# Patient Record
Sex: Male | Born: 1990 | Race: Black or African American | Hispanic: No | Smoking: Current some day smoker
Health system: Southern US, Community
[De-identification: ages and names within clinical notes are randomized; demographics above are authoritative.]

## PROBLEM LIST (undated history)

## (undated) DIAGNOSIS — F32A Depression, unspecified: Secondary | ICD-10-CM

## (undated) DIAGNOSIS — F329 Major depressive disorder, single episode, unspecified: Secondary | ICD-10-CM

## (undated) DIAGNOSIS — S62309A Unspecified fracture of unspecified metacarpal bone, initial encounter for closed fracture: Secondary | ICD-10-CM

## (undated) HISTORY — PX: ORIF METACARPAL FRACTURE: SUR940

## (undated) HISTORY — PX: NO PAST SURGERIES: SHX2092

---
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
## 2010-07-12 LAB — COMPREHENSIVE METABOLIC PANEL
ALT: 10 U/L (ref 0–53)
AST: 16 U/L (ref 0–37)
Albumin: 3.7 g/dL (ref 3.5–5.2)
Alkaline Phosphatase: 70 U/L (ref 39–117)
CO2: 31 mEq/L (ref 19–32)
Calcium: 8.6 mg/dL (ref 8.4–10.5)
Chloride: 104 mEq/L (ref 96–112)
Creatinine, Ser: 0.87 mg/dL (ref 0.4–1.5)
GFR calc non Af Amer: >60 mL/min (ref >60)
Potassium: 3.4 mEq/L — ABNORMAL LOW (ref 3.5–5.1)
Sodium: 138 mEq/L (ref 135–145)
Total Protein: 6.4 g/dL (ref 6.0–8.3)

## 2010-07-12 LAB — CBC
Platelets: 184 10*3/uL (ref 150–400)
RBC: 4.92 MIL/uL (ref 4.22–5.81)
RDW: 12.6 % (ref 11.5–15.5)

## 2010-07-12 LAB — DIFFERENTIAL
Basophils Absolute: 0 10*3/uL (ref 0.0–0.1)
Eosinophils Absolute: 0.1 10*3/uL (ref 0.0–0.7)
Eosinophils Relative: 1 % (ref 0–5)
Lymphocytes Relative: 21 % (ref 12–46)

## 2010-07-26 LAB — BASIC METABOLIC PANEL
BUN: 11 mg/dL (ref 6–23)
Chloride: 101 mEq/L (ref 96–112)
GFR calc Af Amer: >60 mL/min (ref >60)
GFR calc non Af Amer: >60 mL/min (ref >60)
Glucose, Bld: 102 mg/dL — ABNORMAL HIGH (ref 70–99)

## 2010-07-26 LAB — DIFFERENTIAL
Lymphocytes Relative: 24 % (ref 12–46)
Monocytes Absolute: 0.5 10*3/uL (ref 0.1–1.0)
Neutro Abs: 3.2 10*3/uL (ref 1.7–7.7)
Neutrophils Relative %: 62 % (ref 43–77)

## 2010-07-26 LAB — CBC
Hemoglobin: 14.1 g/dL (ref 13.0–17.0)
MCHC: 33.6 g/dL (ref 30.0–36.0)
Platelets: 162 10*3/uL (ref 150–400)
RBC: 4.8 MIL/uL (ref 4.22–5.81)
RDW: 12.5 % (ref 11.5–15.5)

## 2010-07-26 LAB — URINALYSIS, ROUTINE W REFLEX MICROSCOPIC
Hgb urine dipstick: NEGATIVE
Protein, ur: NEGATIVE mg/dL
Urobilinogen, UA: 2 mg/dL — ABNORMAL HIGH (ref 0.0–1.0)
pH: 6 (ref 5.0–8.0)

## 2010-08-22 ENCOUNTER — Emergency Department (HOSPITAL_COMMUNITY)
Admission: EM | Admit: 2010-08-22 | Discharge: 2010-08-22 | Disposition: A | Discharge status: Home / Self Care | Payer: Medicaid Other | Attending: Emergency Medicine | Admitting: Emergency Medicine

## 2010-08-22 DIAGNOSIS — Y9383 Activity, rough housing and horseplay: Secondary | ICD-10-CM | POA: Insufficient documentation

## 2010-08-22 DIAGNOSIS — W19XXXA Unspecified fall, initial encounter: Secondary | ICD-10-CM | POA: Insufficient documentation

## 2010-08-22 DIAGNOSIS — S20219A Contusion of unspecified front wall of thorax, initial encounter: Secondary | ICD-10-CM | POA: Insufficient documentation

## 2010-10-23 ENCOUNTER — Emergency Department (HOSPITAL_COMMUNITY)
Admission: EM | Admit: 2010-10-23 | Discharge: 2010-10-24 | Disposition: A | Discharge status: Home / Self Care | Payer: Medicaid Other | Attending: Emergency Medicine | Admitting: Emergency Medicine

## 2010-10-23 DIAGNOSIS — F1210 Cannabis abuse, uncomplicated: Secondary | ICD-10-CM | POA: Insufficient documentation

## 2010-10-23 DIAGNOSIS — X58XXXA Exposure to other specified factors, initial encounter: Secondary | ICD-10-CM | POA: Insufficient documentation

## 2010-10-23 DIAGNOSIS — S6000XA Contusion of unspecified finger without damage to nail, initial encounter: Secondary | ICD-10-CM | POA: Insufficient documentation

## 2010-10-24 ENCOUNTER — Emergency Department (HOSPITAL_COMMUNITY): Payer: Medicaid Other

## 2010-10-28 ENCOUNTER — Encounter: Payer: Self-pay | Admitting: Emergency Medicine

## 2010-10-28 ENCOUNTER — Emergency Department (HOSPITAL_COMMUNITY)
Admission: EM | Admit: 2010-10-28 | Discharge: 2010-10-28 | Disposition: A | Discharge status: Home / Self Care | Payer: Medicaid Other | Attending: Emergency Medicine | Admitting: Emergency Medicine

## 2010-10-28 DIAGNOSIS — Z202 Contact with and (suspected) exposure to infections with a predominantly sexual mode of transmission: Secondary | ICD-10-CM | POA: Insufficient documentation
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
## 2011-02-12 ENCOUNTER — Emergency Department (HOSPITAL_COMMUNITY): Payer: Medicaid Other

## 2011-02-12 ENCOUNTER — Encounter (HOSPITAL_COMMUNITY): Payer: Self-pay | Admitting: *Deleted

## 2011-02-12 ENCOUNTER — Emergency Department (HOSPITAL_COMMUNITY)
Admission: EM | Admit: 2011-02-12 | Discharge: 2011-02-12 | Disposition: A | Discharge status: Home / Self Care | Payer: Medicaid Other | Attending: Emergency Medicine | Admitting: Emergency Medicine

## 2011-02-12 DIAGNOSIS — M25579 Pain in unspecified ankle and joints of unspecified foot: Secondary | ICD-10-CM | POA: Insufficient documentation

## 2011-02-12 DIAGNOSIS — M25569 Pain in unspecified knee: Secondary | ICD-10-CM | POA: Insufficient documentation

## 2011-02-12 DIAGNOSIS — M79609 Pain in unspecified limb: Secondary | ICD-10-CM | POA: Insufficient documentation

## 2011-02-12 DIAGNOSIS — M25561 Pain in right knee: Secondary | ICD-10-CM

## 2011-02-12 DIAGNOSIS — F17200 Nicotine dependence, unspecified, uncomplicated: Secondary | ICD-10-CM | POA: Insufficient documentation

## 2011-02-12 DIAGNOSIS — M79641 Pain in right hand: Secondary | ICD-10-CM

## 2011-02-12 MED ORDER — ACETAMINOPHEN 500 MG PO TABS
1000.0000 mg | ORAL_TABLET | Freq: Once | ORAL | Status: AC
  Administered 2011-02-12: 1000 mg via ORAL
  Filled 2011-02-12: qty 2

## 2011-02-12 MED ORDER — IBUPROFEN 800 MG PO TABS: 800.0000 mg | ORAL_TABLET | Freq: Once | ORAL | Status: DC
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
## 2011-02-20 ENCOUNTER — Emergency Department (HOSPITAL_COMMUNITY): Payer: Medicaid Other

## 2011-02-20 ENCOUNTER — Encounter (HOSPITAL_COMMUNITY): Payer: Self-pay | Admitting: Emergency Medicine

## 2011-02-20 ENCOUNTER — Emergency Department (HOSPITAL_COMMUNITY)
Admission: EM | Admit: 2011-02-20 | Discharge: 2011-02-20 | Disposition: A | Discharge status: Home / Self Care | Payer: Medicaid Other | Attending: Emergency Medicine | Admitting: Emergency Medicine

## 2011-02-20 DIAGNOSIS — R51 Headache: Secondary | ICD-10-CM | POA: Insufficient documentation

## 2011-02-20 DIAGNOSIS — M542 Cervicalgia: Secondary | ICD-10-CM | POA: Insufficient documentation

## 2011-02-20 DIAGNOSIS — R079 Chest pain, unspecified: Secondary | ICD-10-CM | POA: Insufficient documentation

## 2011-02-20 MED ORDER — ACETAMINOPHEN 500 MG PO TABS
1000.0000 mg | ORAL_TABLET | Freq: Once | ORAL | Status: AC
  Administered 2011-02-20: 1000 mg via ORAL
  Filled 2011-02-20: qty 2

## 2011-02-20 MED ORDER — TRAMADOL-ACETAMINOPHEN 37.5-325 MG PO TABS: ORAL_TABLET | ORAL | Status: AC

## 2011-02-20 MED ORDER — CYCLOBENZAPRINE HCL 10 MG PO TABS
10.0000 mg | ORAL_TABLET | Freq: Once | ORAL | Status: AC
  Administered 2011-02-20: 10 mg via ORAL
  Filled 2011-02-20: qty 1

## 2011-02-20 MED ORDER — CYCLOBENZAPRINE HCL 10 MG PO TABS: 10.0000 mg | ORAL_TABLET | Freq: Three times a day (TID) | ORAL | Status: AC | PRN

## 2011-02-20 NOTE — ED Notes (Signed)
Patient stating he needs to use restroom. Patient assisted with urinal. Denies any other needs at this time.

## 2011-02-20 NOTE — ED Notes (Signed)
Pt left the er stating  No needs self ambulating with a steady gait
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
## 2011-04-05 ENCOUNTER — Encounter (HOSPITAL_COMMUNITY): Payer: Self-pay | Admitting: Emergency Medicine

## 2011-04-05 ENCOUNTER — Emergency Department (HOSPITAL_COMMUNITY)
Admission: EM | Admit: 2011-04-05 | Discharge: 2011-04-05 | Disposition: A | Discharge status: Home / Self Care | Payer: Medicaid Other | Attending: Emergency Medicine | Admitting: Emergency Medicine

## 2011-04-05 DIAGNOSIS — K089 Disorder of teeth and supporting structures, unspecified: Secondary | ICD-10-CM | POA: Insufficient documentation

## 2011-04-05 DIAGNOSIS — F17200 Nicotine dependence, unspecified, uncomplicated: Secondary | ICD-10-CM | POA: Insufficient documentation

## 2011-04-05 DIAGNOSIS — K0889 Other specified disorders of teeth and supporting structures: Secondary | ICD-10-CM

## 2011-04-05 MED ORDER — HYDROCODONE-ACETAMINOPHEN 5-325 MG PO TABS
1.0000 | ORAL_TABLET | Freq: Once | ORAL | Status: AC
  Administered 2011-04-05: 1 via ORAL
  Filled 2011-04-05: qty 1

## 2011-04-05 MED ORDER — PENICILLIN V POTASSIUM 500 MG PO TABS: 500.0000 mg | ORAL_TABLET | Freq: Four times a day (QID) | ORAL | Status: AC

## 2011-04-05 MED ORDER — HYDROCODONE-ACETAMINOPHEN 5-325 MG PO TABS: 1.0000 | ORAL_TABLET | Freq: Four times a day (QID) | ORAL | Status: AC | PRN

## 2011-04-05 MED ORDER — PENICILLIN V POTASSIUM 250 MG PO TABS
500.0000 mg | ORAL_TABLET | Freq: Once | ORAL | Status: AC
  Administered 2011-04-05: 500 mg via ORAL
  Filled 2011-04-05: qty 2

## 2011-04-05 NOTE — ED Provider Notes (Signed)
Medical screening examination/treatment/procedure(s) were performed by non-physician practitioner and as supervising physician I was immediately available for consultation/collaboration.  Donnetta Hutching, MD 04/05/11 [phone]

## 2011-04-05 NOTE — ED Provider Notes (Signed)
History     CSN: [id number] Arrival date & time: 04/05/2011  2:47 PM   None     Chief Complaint  Patient presents with  . Dental Pain    (Consider location/radiation/quality/duration/timing/severity/associated sxs/prior treatment) Patient is a 20 y.o. male presenting with tooth pain. The history is provided by the patient and the spouse. No language interpreter was used.  Dental PainThe primary symptoms include mouth pain. Primary symptoms do not include dental injury. Episode onset: 3 days ago. The symptoms are unchanged. The symptoms occur constantly.  Additional symptoms include: jaw pain.    History reviewed. No pertinent past medical history.  Past Surgical History  Procedure Date  . Wrist surgery     History reviewed. No pertinent family history.  History  Substance Use Topics  . Smoking status: Current Everyday Smoker    Types: Cigarettes  . Smokeless tobacco: Not on file  . Alcohol Use: Yes     Occ      Review of Systems  Allergies  Oxycodone and Ibuprofen  Home Medications  No current outpatient prescriptions on file.  BP 127/66  Pulse 80  Temp 99.1 F (37.3 C)  Resp 20  Ht 5\' 6"  (1.676 m)  Wt 142 lb (64.411 kg)  BMI 22.92 kg/m2  SpO2 99%  Physical Exam  Nursing note and vitals reviewed. Constitutional: He is oriented to person, place, and time. He appears well-developed and well-nourished.  HENT:  Head: Normocephalic and atraumatic.  Nose: Nose normal.  Mouth/Throat: Oropharynx is clear and moist. No uvula swelling. No oropharyngeal exudate.    Eyes: EOM are normal.  Neck: Normal range of motion.  Cardiovascular: Normal rate, regular rhythm, normal heart sounds and intact distal pulses.   Pulmonary/Chest: Effort normal and breath sounds normal. No respiratory distress.  Abdominal: Soft. He exhibits no distension. There is no tenderness.  Musculoskeletal: Normal range of motion.  Neurological: He is alert and oriented to person, place,  and time.  Skin: Skin is warm and dry.  Psychiatric: He has a normal mood and affect. Judgment normal.    ED Course  Procedures (including critical care time)  Labs Reviewed - No data to display No results found.   No diagnosis found.    MDM          Worthy Rancher, PA 04/05/11 [phone]

## 2011-04-05 NOTE — ED Notes (Signed)
Pt c/o right lower toothache x 3 days.  

## 2012-06-21 ENCOUNTER — Encounter (HOSPITAL_COMMUNITY): Payer: Self-pay | Admitting: *Deleted

## 2012-06-21 ENCOUNTER — Emergency Department (HOSPITAL_COMMUNITY)
Admission: EM | Admit: 2012-06-21 | Discharge: 2012-06-22 | Disposition: A | Discharge status: Home / Self Care | Payer: Medicaid Other | Attending: Emergency Medicine | Admitting: Emergency Medicine

## 2012-06-21 DIAGNOSIS — R110 Nausea: Secondary | ICD-10-CM | POA: Insufficient documentation

## 2012-06-21 DIAGNOSIS — F1210 Cannabis abuse, uncomplicated: Secondary | ICD-10-CM | POA: Insufficient documentation

## 2012-06-21 DIAGNOSIS — R1110 Vomiting, unspecified: Secondary | ICD-10-CM

## 2012-06-21 DIAGNOSIS — F1010 Alcohol abuse, uncomplicated: Secondary | ICD-10-CM | POA: Insufficient documentation

## 2012-06-21 DIAGNOSIS — R109 Unspecified abdominal pain: Secondary | ICD-10-CM | POA: Insufficient documentation

## 2012-06-21 DIAGNOSIS — K920 Hematemesis: Secondary | ICD-10-CM | POA: Insufficient documentation

## 2012-06-21 DIAGNOSIS — F17200 Nicotine dependence, unspecified, uncomplicated: Secondary | ICD-10-CM | POA: Insufficient documentation

## 2012-06-21 MED ORDER — FAMOTIDINE 20 MG PO TABS
20.0000 mg | ORAL_TABLET | Freq: Once | ORAL | Status: AC
  Administered 2012-06-21: 20 mg via ORAL
  Filled 2012-06-21: qty 1

## 2012-06-21 MED ORDER — ONDANSETRON 4 MG PO TBDP
4.0000 mg | ORAL_TABLET | Freq: Once | ORAL | Status: AC
  Administered 2012-06-21: 4 mg via ORAL
  Filled 2012-06-21: qty 1

## 2012-06-21 MED ORDER — PANTOPRAZOLE SODIUM 40 MG PO TBEC
40.0000 mg | DELAYED_RELEASE_TABLET | Freq: Once | ORAL | Status: AC
  Administered 2012-06-21: 40 mg via ORAL
  Filled 2012-06-21: qty 1

## 2012-06-21 NOTE — ED Notes (Addendum)
Pt vomiting bright red blood since last night. Pt also c/o abdominal pain

## 2012-06-21 NOTE — ED Provider Notes (Signed)
History     CSN: [id number]  Arrival date & time 06/21/12  2211   First MD Initiated Contact with Patient 06/21/12 2258      Chief Complaint  Patient presents with  . Hematemesis    (Consider location/radiation/quality/duration/timing/severity/associated sxs/prior treatment) HPI Lyndell Val Eagle Whiters is a 22 y.o. male who presents to the Emergency Department complaining of vomiting blood after drinking last night. He has had some vomiting today all with blood specks in the emesis.Today his stomach is in pain.  History reviewed. No pertinent past medical history.  Past Surgical History  Procedure Laterality Date  . Wrist surgery      History reviewed. No pertinent family history.  History  Substance Use Topics  . Smoking status: Current Every Day Smoker    Types: Cigarettes  . Smokeless tobacco: Not on file  . Alcohol Use: Yes     Comment: Occ      Review of Systems  Constitutional: Negative for fever.       10 Systems reviewed and are negative for acute change except as noted in the HPI.  HENT: Negative for congestion.   Eyes: Negative for discharge and redness.  Respiratory: Negative for cough and shortness of breath.   Cardiovascular: Negative for chest pain.  Gastrointestinal: Positive for nausea, vomiting and abdominal pain.       Blood in emesis  Musculoskeletal: Negative for back pain.  Skin: Negative for rash.  Neurological: Negative for syncope, numbness and headaches.  Psychiatric/Behavioral:       No behavior change.    Allergies  Oxycodone and Ibuprofen  Home Medications  No current outpatient prescriptions on file.  BP 129/71  Pulse 78  Temp(Src) 98.3 F (36.8 C) (Oral)  Resp 20  Ht 5\' 6"  (1.676 m)  Wt 145 lb (65.772 kg)  BMI 23.41 kg/m2  SpO2 92%  Physical Exam  Nursing note and vitals reviewed. Constitutional: He is oriented to person, place, and time. He appears well-developed and well-nourished.  Awake, alert, nontoxic appearance.  HENT:   Head: Normocephalic and atraumatic.  Right Ear: External ear normal.  Left Ear: External ear normal.  Mouth/Throat: Oropharynx is clear and moist.  Eyes: EOM are normal. Pupils are equal, round, and reactive to light. Right eye exhibits no discharge. Left eye exhibits no discharge.  Neck: Neck supple.  Cardiovascular: Normal rate.   Pulmonary/Chest: Effort normal and breath sounds normal. He exhibits no tenderness.  Abdominal: Soft. Bowel sounds are normal. There is no tenderness. There is no rebound.  Musculoskeletal: Normal range of motion. He exhibits no tenderness.  Baseline ROM, no obvious new focal weakness.  Neurological: He is alert and oriented to person, place, and time.  Mental status and motor strength appears baseline for patient and situation.  Skin: No rash noted.  Psychiatric: He has a normal mood and affect.    ED Course  Procedures (including critical care time)     MDM  Patient who is a drinker had vomiting with blood in the emesis. Advised him to stop drinking, use protonix and follow up with GI. Referral to GI. Pt stable in ED with no significant deterioration in condition.The patient appears reasonably screened and/or stabilized for discharge and I doubt any other medical condition or other [facility] requiring further screening, evaluation, or treatment in the ED at this time prior to discharge.  MDM Reviewed: nursing note and vitals           Nicoletta Dress. Colon Branch, MD 06/21/12 2345
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
## 2012-06-22 NOTE — ED Notes (Signed)
Discharge instructions reviewed with pt, questions answered. Pt verbalized understanding.  

## 2012-08-21 ENCOUNTER — Emergency Department (HOSPITAL_COMMUNITY)
Admission: EM | Admit: 2012-08-21 | Discharge: 2012-08-21 | Disposition: A | Discharge status: Other Acute Inpatient Hospital | Payer: Medicaid Other | Attending: Emergency Medicine | Admitting: Emergency Medicine

## 2012-08-21 ENCOUNTER — Inpatient Hospital Stay (HOSPITAL_COMMUNITY)
Admission: EM | Admit: 2012-08-21 | Discharge: 2012-08-25 | DRG: 885 | Disposition: A | Discharge status: Home / Self Care | Payer: MEDICAID | Source: Intra-hospital | Attending: Psychiatry | Admitting: Psychiatry

## 2012-08-21 ENCOUNTER — Encounter (HOSPITAL_COMMUNITY): Payer: Self-pay

## 2012-08-21 DIAGNOSIS — F1210 Cannabis abuse, uncomplicated: Secondary | ICD-10-CM

## 2012-08-21 DIAGNOSIS — T43502A Poisoning by unspecified antipsychotics and neuroleptics, intentional self-harm, initial encounter: Secondary | ICD-10-CM | POA: Insufficient documentation

## 2012-08-21 DIAGNOSIS — T424X4A Poisoning by benzodiazepines, undetermined, initial encounter: Secondary | ICD-10-CM | POA: Insufficient documentation

## 2012-08-21 DIAGNOSIS — F329 Major depressive disorder, single episode, unspecified: Secondary | ICD-10-CM | POA: Insufficient documentation

## 2012-08-21 DIAGNOSIS — F39 Unspecified mood [affective] disorder: Principal | ICD-10-CM | POA: Diagnosis present

## 2012-08-21 DIAGNOSIS — T1491XA Suicide attempt, initial encounter: Secondary | ICD-10-CM

## 2012-08-21 DIAGNOSIS — Z79899 Other long term (current) drug therapy: Secondary | ICD-10-CM

## 2012-08-21 DIAGNOSIS — F3289 Other specified depressive episodes: Secondary | ICD-10-CM | POA: Insufficient documentation

## 2012-08-21 DIAGNOSIS — F17200 Nicotine dependence, unspecified, uncomplicated: Secondary | ICD-10-CM | POA: Insufficient documentation

## 2012-08-21 DIAGNOSIS — F909 Attention-deficit hyperactivity disorder, unspecified type: Secondary | ICD-10-CM | POA: Diagnosis present

## 2012-08-21 LAB — COMPREHENSIVE METABOLIC PANEL
ALT: 7 U/L (ref 0–53)
BUN: 17 mg/dL (ref 6–23)
CO2: 25 mEq/L (ref 19–32)
Calcium: 9.7 mg/dL (ref 8.4–10.5)
Creatinine, Ser: 1.02 mg/dL (ref 0.50–1.35)
GFR calc Af Amer: >90 mL/min (ref >90)
GFR calc non Af Amer: >90 mL/min (ref >90)
Glucose, Bld: 73 mg/dL (ref 70–99)

## 2012-08-21 LAB — URINALYSIS, ROUTINE W REFLEX MICROSCOPIC
Glucose, UA: NEGATIVE mg/dL
Leukocytes, UA: NEGATIVE
Protein, ur: NEGATIVE mg/dL
Urobilinogen, UA: 1 mg/dL (ref 0.0–1.0)

## 2012-08-21 LAB — CBC WITH DIFFERENTIAL/PLATELET
Eosinophils Relative: 1 % (ref 0–5)
HCT: 46.2 % (ref 39.0–52.0)
Lymphocytes Relative: 18 % (ref 12–46)
Lymphs Abs: 1.3 10*3/uL (ref 0.7–4.0)
MCV: 84.9 fL (ref 78.0–100.0)
Monocytes Absolute: 0.4 10*3/uL (ref 0.1–1.0)
Monocytes Relative: 6 % (ref 3–12)
RBC: 5.44 MIL/uL (ref 4.22–5.81)
WBC: 7.3 10*3/uL (ref 4.0–10.5)

## 2012-08-21 LAB — RAPID URINE DRUG SCREEN, HOSP PERFORMED
Barbiturates: NOT DETECTED
Cocaine: NOT DETECTED

## 2012-08-21 MED ORDER — ACETAMINOPHEN 325 MG PO TABS: 650.0000 mg | ORAL_TABLET | Freq: Four times a day (QID) | ORAL | Status: DC | PRN

## 2012-08-21 MED ORDER — ALUM & MAG HYDROXIDE-SIMETH 200-200-20 MG/5ML PO SUSP: 30.0000 mL | ORAL | Status: DC | PRN

## 2012-08-21 MED ORDER — ZOLPIDEM TARTRATE 5 MG PO TABS: 5.0000 mg | ORAL_TABLET | Freq: Every evening | ORAL | Status: DC | PRN

## 2012-08-21 MED ORDER — MAGNESIUM HYDROXIDE 400 MG/5ML PO SUSP: 30.0000 mL | Freq: Every day | ORAL | Status: DC | PRN

## 2012-08-21 MED ORDER — ONDANSETRON 4 MG PO TBDP
4.0000 mg | ORAL_TABLET | Freq: Once | ORAL | Status: AC
  Administered 2012-08-21: 4 mg via ORAL

## 2012-08-21 MED ORDER — ONDANSETRON 4 MG PO TBDP
ORAL_TABLET | ORAL | Status: AC
  Administered 2012-08-21: 4 mg via ORAL
  Filled 2012-08-21: qty 1

## 2012-08-21 NOTE — BH Assessment (Signed)
[facility] Assessment Progress Note      Accepted to [address] hall to Dr. Ree Shay service by Dr. Laury Deep.

## 2012-08-21 NOTE — ED Notes (Signed)
Mom states pt posted on facebook last night he didn't have anything to live for and nobody loved him. States he has been depressed for about a year because a friend hung herself and he feels guilty. Mom states pt has never been admitted for any psych issues in the past

## 2012-08-21 NOTE — ED Provider Notes (Signed)
Patient posted a suicide type noted on face book last evening. Denies any suicidal thoughts here and also had a serious overdose patient needs to be IVC and needs to have inpatient care.    James Jakes, MD 08/21/12 [phone]

## 2012-08-21 NOTE — ED Notes (Addendum)
Pt come to desk and stated that he needed to leave to go be with his family.  Pt states that "My brother just texted me to tell me my niece died."  Pt unable to provide details such as brother's last name or provide proof to validate what he was saying.   Explained to pt that since he was under IVC, he would not be free to leave as he wished.  Explained to pt that he could speak with the accepting physician and provide documentation to support claim, and perhaps legally he could be released, but unless the physician did so, we were unable to let him leave department at this time.

## 2012-08-21 NOTE — Tx Team (Signed)
Initial Interdisciplinary Treatment Plan  PATIENT STRENGTHS: (choose at least two) Ability for insight Active sense of humor Average or above average intelligence Physical Health Supportive family/friends  PATIENT STRESSORS: Legal issue Marital or family conflict Occupational concerns Substance abuse   PROBLEM LIST: Problem List/Patient Goals Date to be addressed Date deferred Reason deferred Estimated date of resolution  Substance Abuse 08/22/12                                                      DISCHARGE CRITERIA:  Ability to meet basic life and health needs Adequate post-discharge living arrangements Improved stabilization in mood, thinking, and/or behavior Motivation to continue treatment in a less acute level of care Verbal commitment to aftercare and medication compliance  PRELIMINARY DISCHARGE PLAN: Attend 12-step recovery group Outpatient therapy Return to previous living arrangement  PATIENT/FAMIILY INVOLVEMENT: This treatment plan has been presented to and reviewed with the patient, James Meadows, and/or family member.  The patient and family have been given the opportunity to ask questions and make suggestions.  [name] [facility] 08/21/2012, 11:28 PM

## 2012-08-21 NOTE — ED Notes (Signed)
Pt has vomited x 2

## 2012-08-21 NOTE — ED Notes (Signed)
Deputy to department to escort to [facility].

## 2012-08-21 NOTE — ED Provider Notes (Signed)
History    This chart was scribed for [employer]. Rubin Payor, MD by Marlyne Beards, ED Scribe. The patient was seen in room APA02/APA02. Patient's care was started at 11:29 AM.    CSN: [id number]  Arrival date & time 08/21/12  1123   First MD Initiated Contact with Patient 08/21/12 1129      Chief Complaint  Patient presents with  . Drug Overdose    (Consider location/radiation/quality/duration/timing/severity/associated sxs/prior treatment) The history is provided by the patient and a relative. No language interpreter was used.   James Meadows is a 22 y.o. male who presents to the Emergency Department complaining of a possible drug overdose. Pt states that he took an unknown persons Klonopin (about 12 pills) and drank a 1/2 of a 1/2 gallon of liquor. Mom states that pt posted on Facebook last night that he had nothing to live for anymore and nobody loved him. Pt states that he has been depressed for about a year now due to a friend hanging herself in the past. Pt somewhat blames himself for incident. Mother states pt has never been admitted for any psych issues in the past. Pt denies fever, chills, cough, nausea, vomiting, diarrhea, SOB, weakness, and any other associated symptoms.   History reviewed. No pertinent past medical history.  Past Surgical History  Procedure Laterality Date  . Wrist surgery      No family history on file.  History  Substance Use Topics  . Smoking status: Current Every Day Smoker    Types: Cigarettes  . Smokeless tobacco: Not on file  . Alcohol Use: Yes     Comment: Occ      Review of Systems  Constitutional: Positive for activity change.  Psychiatric/Behavioral: Positive for suicidal ideas and self-injury.  All other systems reviewed and are negative.    Allergies  Oxycodone and Ibuprofen  Home Medications  No current outpatient prescriptions on file.  BP 133/79  Pulse 96  Resp 24  SpO2 98%  Physical Exam  Nursing note and vitals  reviewed. Constitutional: He is oriented to person, place, and time. He appears well-developed and well-nourished. No distress.  Sedated and tearful  HENT:  Head: Normocephalic and atraumatic.  Eyes: EOM are normal.   Pt's pupils are a 3 and reactive.  Neck: Neck supple. No tracheal deviation present.  Cardiovascular: Normal rate.   Pulmonary/Chest: Effort normal. No respiratory distress.  Musculoskeletal: Normal range of motion.  Neurological: He is alert and oriented to person, place, and time.  Skin: Skin is warm and dry.  Psychiatric: He has a normal mood and affect. His behavior is normal.    ED Course  Procedures (including critical care time) DIAGNOSTIC STUDIES: Oxygen Saturation is 98% on room air, normal by my interpretation.    COORDINATION OF CARE: 11:41 AM Discussed ED treatment with pt and pt agrees.      Labs Reviewed  SALICYLATE LEVEL - Abnormal; Notable for the following:    Salicylate Lvl <2.0 (*)    All other components within normal limits  CBC WITH DIFFERENTIAL  COMPREHENSIVE METABOLIC PANEL  ETHANOL  ACETAMINOPHEN LEVEL  URINE RAPID DRUG SCREEN (HOSP PERFORMED)  URINALYSIS, ROUTINE W REFLEX MICROSCOPIC   No results found.   1. Benzodiazepine (tranquilizer) overdose, initial encounter   2. Suicide attempt      Date: 08/21/2012  Rate: 80  Rhythm: normal sinus rhythm  QRS Axis: normal  Intervals: normal  ST/T Wave abnormalities: normal  Conduction Disutrbances: none  Narrative Interpretation:  unremarkable     MDM  Patient on overdose of alcohol and Klonopin. Reportedly started last night it went through to the morning. He appears to be medically cleared at this time. Urine drug screen is still pending. He states that he took it in a suicide attempt and will be seen by the ACT team for further placement.    I personally performed the services described in this documentation, which was scribed in my presence. The recorded information has  been reviewed and is accurate.          Juliet Rude. Rubin Payor, MD 08/21/12 1421
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
## 2012-08-22 ENCOUNTER — Encounter (HOSPITAL_COMMUNITY): Payer: Self-pay | Admitting: Psychiatry

## 2012-08-22 DIAGNOSIS — F909 Attention-deficit hyperactivity disorder, unspecified type: Secondary | ICD-10-CM | POA: Diagnosis present

## 2012-08-22 DIAGNOSIS — F1210 Cannabis abuse, uncomplicated: Secondary | ICD-10-CM | POA: Diagnosis present

## 2012-08-22 DIAGNOSIS — F39 Unspecified mood [affective] disorder: Secondary | ICD-10-CM | POA: Diagnosis present

## 2012-08-22 LAB — RAPID URINE DRUG SCREEN, HOSP PERFORMED
Amphetamines: NOT DETECTED
Opiates: NOT DETECTED

## 2012-08-22 MED ORDER — TRAZODONE HCL 50 MG PO TABS
50.0000 mg | ORAL_TABLET | Freq: Every evening | ORAL | Status: DC | PRN
  Administered 2012-08-22: 50 mg via ORAL
  Filled 2012-08-22: qty 1

## 2012-08-22 NOTE — Progress Notes (Signed)
D: Patient denies SI/HI and A/V hallucinations; patient reports is sleep was well because " I was messed up";  ; reports energy level is normal ; reports ability to pay attention is normal ; rates depression as -/10; rates hopelessness -/10; rates anxiety as -/10;   A: Monitored q 15 minutes; patient encouraged to attend groups; patient educated about medications; patient given medications per physician orders; patient encouraged to express feelings and/or concerns  R: Patient is very child like and playful on the unit and insist that he does not need to be here; patient's interaction with staff and peers is appropriate;; patient is taking medications as prescribed and tolerating medications; patient is attended one group but patient is superficial about the situation and minimizes

## 2012-08-22 NOTE — Tx Team (Signed)
Interdisciplinary Treatment Plan Update (Adult)  Date: 08/22/2012  Time Reviewed: 9:45 AM   Progress in Treatment: Attending groups: Yes Participating in groups: Yes Taking medication as prescribed:  Yes Tolerating medication:  Yes Family/Significant othe contact made: Not as yet Patient understands diagnosis: Yes Discussing patient identified problems/goals with staff: Yes Medical problems stabilized or resolved:  Yes Denies suicidal/homicidal ideation: Yes Patient has not harmed self or Others: Yes  New problem(s) identified: None Identified  Discharge Plan or Barriers:  CSW to assess for appropriate referrals.   Additional comments: N/A  Reason for Continuation of Hospitalization: Medication stabilization Crisi Stabilization  Estimated length of stay: 3- 5 days as no one has yet to meet with patient other than his brief attendance at DC planning group    Attendees: Patient:   Family:     Physician:  Geoffery Lyons 08/22/2012 9:45 AM   Nursing: Carney Living, RN    08/22/2012 9:45 AM   Clinical Social Worker Ronda Fairly 08/22/2012 9:45 AM   Other:  Dorinda Hill, Sherrie Sport PA Student 08/22/2012 9:45 AM   Other:  Enid Baas, PA 08/22/2012 9:45 AM   Other:  Titus Mould, Community Care Coordinator 08/22/2012 9:45 AM   Other:  Serena Colonel, PA 08/22/2012 9:45 AM    Scribe for Treatment Team:   Carney Bern, LCSWA  08/22/2012 9:45 AM

## 2012-08-22 NOTE — [facility] Group Notes (Signed)
[facility] LCSW Aftercare Discharge Planning Group Note   08/22/2012  8:45 AM  Participation Quality:  Adequate  Mood/Affect:  Flat, Lethargic and Intrussive  Depression Rating:  1  Anxiety Rating:  1  Thoughts of Suicide:  No Will you contract for safety?   NA  Current AVH:  No  Plan for Discharge/Comments:  Return home to parents with whom he lives in [place]  Transportation Means: Family or sheriff  Supports: Family  Tycen Dockter, Julious Payer

## 2012-08-22 NOTE — Progress Notes (Signed)
Patient ID: James Meadows, male   DOB: Aug 29, 1990, 22 y.o.   MRN: [id number] D: Patient mood/affect is appropriate to situation. Pt asked when he can be discharged and misses his family. Pt does seem invested in treatment and minimize his condition. Pt denies SI/HI/AVH and pain. Pt attended evening wrap up group and Interacted appropriately with peers. Pt denies any needs or concerns.  Cooperative with assessment. No acute distressed noted at this time.   A: Met with pt 1:1. Medications administered as prescribed. Writer encouraged pt to discuss feelings. Pt encouraged to come to staff with any question or concerns. 15 minutes checks for safety.  R: Patient remains safe. He is complaint with medications and denies any adverse reaction. Continue current POC.
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
## 2012-08-22 NOTE — Progress Notes (Signed)
[facility] LCSW Group Therapy  08/22/2012 1:15 PM  Type of Therapy:  Group Therapy 1:15 to 2:30 PM  Participation Level:  Did Not Attend   Clide Dales 08/22/2012, 4:41 PM
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
## 2012-08-22 NOTE — Progress Notes (Signed)
Patient ID: James Meadows, male   DOB: 1991-01-06, 22 y.o.   MRN: [id number]  D: Took over patient's care @ 2330. Patient in bed sleeping. Respiration regular and unlabored. No sign of distress noted at this time.  A: 15 mins checks for safety.  R: Patient remains asleep. Pt is safe.

## 2012-08-23 DIAGNOSIS — F39 Unspecified mood [affective] disorder: Principal | ICD-10-CM

## 2012-08-23 DIAGNOSIS — F1210 Cannabis abuse, uncomplicated: Secondary | ICD-10-CM

## 2012-08-23 DIAGNOSIS — F909 Attention-deficit hyperactivity disorder, unspecified type: Secondary | ICD-10-CM

## 2012-08-23 MED ORDER — TRAZODONE HCL 100 MG PO TABS
100.0000 mg | ORAL_TABLET | Freq: Every evening | ORAL | Status: DC | PRN
  Administered 2012-08-23 – 2012-08-24 (×2): 100 mg via ORAL
  Filled 2012-08-23 (×2): qty 1

## 2012-08-23 NOTE — Progress Notes (Signed)
[facility] MD Progress Note  08/23/2012 3:51 PM Zaquan RUSTY VILLELLA  MRN:  [id number]  Subjective: Lucy reports, "The reason that I came here is because some reported that I took bunch of Kolanadin (Klonopin) pills to kill myself. But that was not the truth. I took 2 little green sleeping pills to help me sleep. I was not trying to kill myself. I feel aggravated when someone lies on me like that. I miss my 2 kids".  Diagnosis:  Axis I: ADHD, hyperactive type and Marijuana abuse, Episodic mood disorder Axis II: Deferred Axis III: History reviewed. No pertinent past medical history. Axis IV: other psychosocial or environmental problems Axis V: 41-50 serious symptoms  ADL's:  Fair  Sleep: Fair  Appetite:  Fair  Suicidal Ideation:  Plan:  denies Intent:  Denies Means:  Denies Homicidal Ideation:  Plan:  Denies Intent:  Denies Means:  [name]  AEB (as evidenced by): Per patient's reports.  Psychiatric Specialty Exam: Review of Systems  Constitutional: Negative.   HENT: Negative.   Eyes: Negative.   Respiratory: Negative.   Cardiovascular: Negative.   Gastrointestinal: Negative.   Genitourinary: Negative.   Musculoskeletal: Negative.   Skin: Negative.        Numerous tattoos  Neurological: Negative.   Endo/Heme/Allergies: Negative.   Psychiatric/Behavioral: Positive for depression and substance abuse. Negative for suicidal ideas, hallucinations and memory loss. The patient has insomnia. The patient is not nervous/anxious.     Blood pressure 108/68, pulse 109, temperature 98.2 F (36.8 C), temperature source Other (Comment), resp. rate 18, height 5\' 7"  (1.702 m), weight 63.05 kg (139 lb).Body mass index is 21.77 kg/(m^2).  General Appearance: Disheveled  Eye Contact::  Poor  Speech:  Clear and Coherent  Volume:  Normal  Mood:  Depressed  Affect:  Flat and Tearful  Thought Process:  Coherent  Orientation:  Full (Time, Place, and Person)  Thought Content:  Rumination  Suicidal  Thoughts:  No  Homicidal Thoughts:  No  Memory:  Immediate;   Good Recent;   Good Remote;   Good  Judgement:  Fair  Insight:  Fair  Psychomotor Activity:  Normal  Concentration:  Fair  Recall:  Good  Akathisia:  No  Handed:  Right  AIMS (if indicated):     Assets:  Desire for Improvement  Sleep:  Number of Hours: 6.5   Current Medications: Current Facility-Administered Medications  Medication Dose Route Frequency Provider Last Rate Last Dose  . acetaminophen (TYLENOL) tablet 650 mg  650 mg Oral Q6H PRN Larena Sox, MD      . alum & mag hydroxide-simeth (MAALOX/MYLANTA) 200-200-20 MG/5ML suspension 30 mL  30 mL Oral Q4H PRN Larena Sox, MD      . magnesium hydroxide (MILK OF MAGNESIA) suspension 30 mL  30 mL Oral Daily PRN Larena Sox, MD      . traZODone (DESYREL) tablet 50 mg  50 mg Oral QHS PRN Rachael Fee, MD   50 mg at 08/22/12 2213    Lab Results:  Results for orders placed during the hospital encounter of 08/21/12 (from the past 48 hour(s))  URINE RAPID DRUG SCREEN (HOSP PERFORMED)     Status: Abnormal   Collection Time    08/22/12  6:49 AM      Result Value Range   Opiates NONE DETECTED  NONE DETECTED   Cocaine NONE DETECTED  NONE DETECTED   Benzodiazepines POSITIVE (*) NONE DETECTED   Amphetamines NONE DETECTED  NONE DETECTED  Tetrahydrocannabinol POSITIVE (*) NONE DETECTED   Barbiturates NONE DETECTED  NONE DETECTED   Comment:            DRUG SCREEN FOR MEDICAL PURPOSES     ONLY.  IF CONFIRMATION IS NEEDED     FOR ANY PURPOSE, NOTIFY LAB     WITHIN 5 DAYS.                LOWEST DETECTABLE LIMITS     FOR URINE DRUG SCREEN     Drug Class       Cutoff (ng/mL)     Amphetamine      1000     Barbiturate      200     Benzodiazepine   200     Tricyclics       300     Opiates          300     Cocaine          300     THC              50    Physical Findings: AIMS: Facial and Oral Movements Muscles of Facial Expression: None, normal Lips and  Perioral Area: None, normal Jaw: None, normal Tongue: None, normal,Extremity Movements Upper (arms, wrists, hands, fingers): None, normal Lower (legs, knees, ankles, toes): None, normal, Trunk Movements Neck, shoulders, hips: None, normal, Overall Severity Severity of abnormal movements (highest score from questions above): None, normal Incapacitation due to abnormal movements: None, normal Patient's awareness of abnormal movements (rate only patient's report): No Awareness, Dental Status Current problems with teeth and/or dentures?: No Does patient usually wear dentures?: No  CIWA:    COWS:     Treatment Plan Summary: Daily contact with patient to assess and evaluate symptoms and progress in treatment Medication management  Plan: Supportive approach/coping skills/relapse prevention. Encouraged out of room, participation in group sessions and application of coping skills when distressed. Will continue to monitor response to/adverse effects of medications in use to assure effectiveness. Continue to monitor mood, behavior and interaction with staff and other patients. Continue current plan of care.  Medical Decision Making Problem Points:  Established problem, stable/improving (1), Review of last therapy session (1) and Review of psycho-social stressors (1) Data Points:  Review of medication regiment & side effects (2) Review of new medications or change in dosage (2)  I certify that inpatient services furnished can reasonably be expected to improve the patient's condition.   Armandina Stammer I 08/23/2012, 3:51 PM

## 2012-08-23 NOTE — Progress Notes (Signed)
Patient attended meeting AA meeting

## 2012-08-23 NOTE — Progress Notes (Signed)
D   Pt has been steadily focused on being discharged today   He brings the issue up at all groups and has been instructed several times on the process for discharge   He does attend group but is very limited in his participation    A   Verbal support given   Medications administered and effectiveness monitored    Q 15 min checks R   Pt safe at present

## 2012-08-23 NOTE — Progress Notes (Signed)
Psychoeducational Group Note  Date:  08/23/2012 Time:  0945 am  Group Topic/Focus:  Identifying Needs:   The focus of this group is to help patients identify their personal needs that have been historically problematic and identify healthy behaviors to address their needs.  Participation Level:  Minimal  Participation Quality:  Resistant  Affect:  Defensive  Cognitive:  Alert  Insight:  Limited  Engagement in Group:  Limited  Additional Comments:    James Meadows 08/23/2012,2:45 PM
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
## 2012-08-23 NOTE — [facility] Group Notes (Signed)
[facility] Group Notes:  (Clinical Social Work)  08/23/2012     10-11AM  Summary of Progress/Problems:   The main focus of today's process group was for the patient to identify ways in which they have in the past sabotaged their own recovery. Motivational Interviewing was utilized to ask the group members what they get out of their substance use, and what reasons they may have for wanting to change.  The Stages of Change were explained using a handout, and patients identified where they currently are with regard to stages of change.  The patient expressed that he drinks when his family ignores him, which contradicted what he had just a few minutes previously told CSW during PSA about his alcohol consumption being once every 3 months or so "for celebration purposes only."  He smokes to feel calm, and states that he smokes marijuana every 2-3 hours during the day.  He does not believe that the marijuana is a problem for him, and admits freely that he is in the pre-contemplation stage.  On the other hand, he feels his anger is a major issue, and he is in the preparation to handle that.  Type of Therapy:  Group Therapy - Process   Participation Level:  Active  Participation Quality:  Intrusive and Redirectable  Affect:  Blunted  Cognitive:  Disorganized  Insight:  Improving  Engagement in Therapy:  Engaged  Modes of Intervention:  Education, Support and Processing, Motivational Interviewing  Ambrose Mantle, LCSW 08/23/2012, 11:14 AM

## 2012-08-23 NOTE — B[facility] Group Notes (Signed)
Pt attended self inventory group and participated appropriately 

## 2012-08-24 NOTE — Progress Notes (Signed)
Adult Psychoeducational Group Note  Date:  08/24/2012 Time:  8:20 PM  Group Topic/Focus:  Goals Group:   The focus of this group is to help patients establish daily goals to achieve during treatment and discuss how the patient can incorporate goal setting into their daily lives to aide in recovery.  Participation Level:  Active  Participation Quality:  Appropriate  Affect:  Appropriate  Cognitive:  Alert  Insight: Appropriate  Engagement in Group:  Engaged  Modes of Intervention:  Discussion  Additional Comments:  Attended AA  Aldona Lento 08/24/2012, 8:20 PM

## 2012-08-24 NOTE — Progress Notes (Signed)
Psychoeducational Group Note  Date:  08/24/2012 Time:  0945 am  Group Topic/Focus:  Making Healthy Choices:   The focus of this group is to help patients identify negative/unhealthy choices they were using prior to admission and identify positive/healthier coping strategies to replace them upon discharge.  Participation Level:  None  Participation Quality:  Resistant  Affect:  Irritable  Cognitive:  Alert  Insight:  None  Engagement in Group:  None  Additional Comments:  Pt got up and walked out saying this is hurting more than helping  Andrena Mews 08/24/2012, 10:29 AM

## 2012-08-24 NOTE — Progress Notes (Signed)
Patient ID: James Meadows, male   DOB: 1990/05/10, 22 y.o.   MRN: [id number] [facility] MD Progress Note  08/24/2012 3:53 PM James Meadows  MRN:  [id number]  Subjective: James Meadows reports that he is feeling like he is here to stay. Reports that he is not crazy and should not be treated like one. Says he is not a pill popper. Complained that this place is not helping him at this point. He adds that he thinks that every body here thinks like he is crazy. As a result, it makes him feel aggravated.  Diagnosis:  Axis I: ADHD, hyperactive type and Marijuana abuse, Episodic mood disorder Axis II: Deferred Axis III: History reviewed. No pertinent past medical history. Axis IV: other psychosocial or environmental problems Axis V: 41-50 serious symptoms  ADL's:  Fair  Sleep: Fair  Appetite:  Fair  Suicidal Ideation:  Plan:  denies Intent:  Denies Means:  Denies Homicidal Ideation:  Plan:  Denies Intent:  Denies Means:  James Meadows  AEB (as evidenced by): Per patient's reports.  Psychiatric Specialty Exam: Review of Systems  Constitutional: Negative.   HENT: Negative.   Eyes: Negative.   Respiratory: Negative.   Cardiovascular: Negative.   Gastrointestinal: Negative.   Genitourinary: Negative.   Musculoskeletal: Negative.   Skin: Negative.        Numerous tattoos  Neurological: Negative.   Endo/Heme/Allergies: Negative.   Psychiatric/Behavioral: Positive for depression and substance abuse. Negative for suicidal ideas, hallucinations and memory loss. The patient has insomnia. The patient is not nervous/anxious.     Blood pressure 109/81, pulse 91, temperature 98 F (36.7 C), temperature source Other (Comment), resp. rate 16, height 5\' 7"  (1.702 m), weight 63.05 kg (139 lb).Body mass index is 21.77 kg/(m^2).  General Appearance: Disheveled  Eye Contact::  Poor  Speech:  Clear and Coherent  Volume:  Normal  Mood:  Depressed  Affect:  Flat and Tearful  Thought Process:  Coherent  Orientation:   Full (Time, Place, and Person)  Thought Content:  Rumination  Suicidal Thoughts:  No  Homicidal Thoughts:  No  Memory:  Immediate;   Good Recent;   Good Remote;   Good  Judgement:  Fair  Insight:  Fair  Psychomotor Activity:  Normal  Concentration:  Fair  Recall:  Good  Akathisia:  No  Handed:  Right  AIMS (if indicated):     Assets:  Desire for Improvement  Sleep:  Number of Hours: 6   Current Medications: Current Facility-Administered Medications  Medication Dose Route Frequency Provider Last Rate Last Dose  . acetaminophen (TYLENOL) tablet 650 mg  650 mg Oral Q6H PRN Larena Sox, MD      . alum & mag hydroxide-simeth (MAALOX/MYLANTA) 200-200-20 MG/5ML suspension 30 mL  30 mL Oral Q4H PRN Larena Sox, MD      . magnesium hydroxide (MILK OF MAGNESIA) suspension 30 mL  30 mL Oral Daily PRN Larena Sox, MD      . traZODone (DESYREL) tablet 100 mg  100 mg Oral QHS PRN Sanjuana Kava, NP   100 mg at 08/23/12 2156    Lab Results:  No results found for this or any previous visit (from the past 48 hour(s)).  Physical Findings: AIMS: Facial and Oral Movements Muscles of Facial Expression: None, normal Lips and Perioral Area: None, normal Jaw: None, normal Tongue: None, normal,Extremity Movements Upper (arms, wrists, hands, fingers): None, normal Lower (legs, knees, ankles, toes): None, normal, Trunk Movements Neck, shoulders, hips:  None, normal, Overall Severity Severity of abnormal movements (highest score from questions above): None, normal Incapacitation due to abnormal movements: None, normal Patient's awareness of abnormal movements (rate only patient's report): No Awareness, Dental Status Current problems with teeth and/or dentures?: No Does patient usually wear dentures?: No  CIWA:    COWS:     Treatment Plan Summary: Daily contact with patient to assess and evaluate symptoms and progress in treatment Medication management  Plan: Supportive  approach/coping skills/relapse prevention. Encouraged out of room, participation in group sessions and application of coping skills when distressed. Will continue to monitor response to/adverse effects of medications in use to assure effectiveness. Continue to monitor mood, behavior and interaction with staff and other patients. Continue current plan of care.  Medical Decision Making Problem Points:  Established problem, stable/improving (1), Review of last therapy session (1) and Review of psycho-social stressors (1) Data Points:  Review of medication regiment & side effects (2) Review of new medications or change in dosage (2)  I certify that inpatient services furnished can reasonably be expected to improve the patient's condition.   Armandina Stammer I 08/24/2012, 3:53 PM

## 2012-08-24 NOTE — B[facility] Group Notes (Signed)
[facility] Group Notes:  (Clinical Social Work)  08/24/2012  10:00-11:00AM  Summary of Progress/Problems:   The main focus of today's process group was to   identify the patient's current support system and decide on other supports that can be put in place.  Four definitions/levels of support were discussed and an exercise was utilized to show how much stronger we become with additional supports.  An emphasis was placed on using counselor, doctor, therapy groups, 12-step groups, and problem-specific support groups to expand supports, as well as doing something different than has been done before. The patient expressed a willingness to add a return to church, but refuses to add other supports.  He has the support of his mother, brother, sister, children, and his mother has asked him to stop smoking marijuana so he says he is going to do this, but will not have trouble, will just stop.  He says AA/NA are triggers for him.  He was very distracting during group and kept asking how to leave the hospital.  Type of Therapy:  Process Group with Motivational Interviewing  Participation Level:  Active  Participation Quality:  Intrusive and Redirectable  Affect:  Excited  Cognitive:  Oriented  Insight:  Distracting and Improving  Engagement in Therapy:  Developing/Improving  Modes of Intervention:   Education, Support and Processing, Activity  Ambrose Mantle, LCSW 08/24/2012, 11:14 AM

## 2012-08-24 NOTE — Progress Notes (Signed)
D   Pt has been isolative and irritable today   He attended group but expressed negativity during group and got up and walked out    He has no insight into his illness and doesn't want to hear anything about it A   Verbal support given  Medications administered and effectiveness monitored   Q 15 min checks  R   Pt safe at present
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
## 2012-08-24 NOTE — Progress Notes (Signed)
Pt intrusive this evening requiring redirect. At nurses' station during AA group demanding to know why he was admitted. "No one will tell me. I've asked everyone." Read to pt what was written on his IVC papers though he denies any truth to it. "I can't believe someone would do me like that." Told patient he is expected to attend all groups but pt refused. "I'm going to take a shower just in case you all think I'm going to kill myself." Redirected patient's behavior and explained staff's expectation for his behavior. Pt somewhat receptive. Medicated per orders, trazadone given per his request which has been effective. Pt displays no insight into his substance use and remains focused on discharge. Denies HI/AVH. James Meadows

## 2012-08-24 NOTE — Progress Notes (Signed)
Patient did not attend the evening speaker AA meeting. Pt showered and remained in room during group time.  Pt reported to this writer that he made some statements about wanting to end his life but would never really do it.  Pt reports feeling really down lately because he went two weeks without seeing his family and felt very alone.  Pt reports never feeling depressed before and would not kill himself.  Pt also denies taking "28 klonopin".  Pt stated, "I took two, and someone I know told my mom that I had 28 pills. I would be dead right now if I took 28 pills. I wouldn't be talking to you." Pt stated, " I felt like no one cared about me, but now I know they do."  This writer assured pt that we are not a long term facility and to not be so focused on having to leave.  Pt was told to think about how his mom felt when she found him and was unable to wake him, how scared she must have been.  Pt was also advised to use his time here to learn some skills so if he feels down or lonely in the future he can better communicate to his family.

## 2012-08-25 MED ORDER — TRAZODONE HCL 100 MG PO TABS: 100.0000 mg | ORAL_TABLET | Freq: Every evening | ORAL | Status: DC | PRN

## 2012-08-25 NOTE — Progress Notes (Signed)
Pt was discharged home today.  He denied any S/I H/I or A/V hallucinations.    He was given f/u appointment, rx, sample medications, hotline info booklet.  He voiced understanding to all instructions provided.  He declined the need for smoking cessation materials.  

## 2012-08-25 NOTE — Progress Notes (Signed)
Patient ID: James Meadows, male   DOB: [birth year], 22 y.o.   MRN: [id number] CSW has tried once again to contact patient's mother in order to provide suicide prevention education along with information of followup appointment as [facility] called to finalize followup appointment after patient left. Two numbers called in effort  to reach patient's mother, Laurina Bustle were [phone] and [phone]. Message was left on patient's cell phone [phone]) with information for followup at [facility] this Wed at 1:45 PM.  Carney Bern, LCSWA

## 2012-08-25 NOTE — [facility] Suicide Risk Assessment (Signed)
[facility] INPATIENT:  Family/Significant Other Suicide Prevention Education  Suicide Prevention Education:  Contact Attempts: Patient's mother  has been identified by the patient as the family member/significant other with whom the patient will be residing, and identified as the person(s) who will aid the patient in the event of a mental health crisis.  With written consent from the patient, two attempts were made to provide suicide prevention education, prior to and/or following the patient's discharge.  We were unsuccessful in providing suicide prevention education.  A suicide education pamphlet was given to the patient to share with family/significant other.  Date and time of first attempt: 08/25/2012/8:45 AM Date and time of second attempt: 08/25/2012 /12:23 PM   Clide Dales 08/25/2012, 12:23 PM

## 2012-08-25 NOTE — Progress Notes (Signed)
[facility] Adult Case Management Discharge Plan :  Will you be returning to the same living situation after discharge: Yes,  with mom At discharge, do you have transportation home?:Yes,  Mother Do you have the ability to pay for your medications:Yes,  at [facility]  Release of information consent forms completed and in the chart;  Patient's signature needed at discharge.  Patient to Follow up at: Follow-up Information   Follow up with [facility] On 08/27/2012. (Appointment at 1:45 PM on Wednesday 08/27/12)    Contact information:   [address] [place], [place] [address] [facility] [phone] FAX [phone]      Patient denies SI/HI:   Yes,  denies both    Safety Planning and Suicide Prevention discussed:  Yes,  with patient as unsuccessful in reaching patient's mother  Clide Dales 08/25/2012, 5:51 PM

## 2012-08-25 NOTE — [facility] Suicide Risk Assessment (Signed)
Suicide Risk Assessment  Discharge Assessment     Demographic Factors:  Male and Adolescent or young adult  Mental Status Per Nursing Assessment::   On Admission:  NA  Current Mental Status by Physician: In full contact with reality. There are no suicidal ideas plans or intent. His mood is euthymic his affect is appropriate. He is willing and motivated to pursue outpatient treatment   Loss Factors: Legal issues  Historical Factors: Impulsivity  Risk Reduction Factors:   Responsible for children under 24 years of age, Sense of responsibility to family, Employed, Living with another person, especially a relative and Positive social support  Continued Clinical Symptoms:  Alcohol/Substance Abuse/Dependencies  Cognitive Features That Contribute To Risk:  Closed-mindedness Thought constriction (tunnel vision)    Suicide Risk:  Minimal: No identifiable suicidal ideation.  Patients presenting with no risk factors but with morbid ruminations; may be classified as minimal risk based on the severity of the depressive symptoms  Discharge Diagnoses:   AXIS I:  Mood Disorder NOS, ADHD, Marijuana Abuse AXIS II:  Deferred AXIS III:  History reviewed. No pertinent past medical history. AXIS IV:  problems related to legal system/crime AXIS V:  61-70 mild symptoms  Plan Of Care/Follow-up recommendations:  Activity:  as tolerated Diet:  regular Will follow up [facility] Is patient on multiple antipsychotic therapies at discharge:  No   Has Patient had three or more failed trials of antipsychotic monotherapy by history:  No  Recommended Plan for Multiple Antipsychotic Therapies: N/A   James Meadows A 08/25/2012, 9:43 AM

## 2012-08-25 NOTE — [facility] Group Notes (Signed)
[facility] LCSW Aftercare Discharge Planning Group Note   08/25/2012 8:45 AM  Participation Quality:  Adequate  Mood/Affect:  Appropriate  Depression Rating:  1  Anxiety Rating:  1  Thoughts of Suicide:  No Will you contract for safety?   NA  Current AVH:  No  Plan for Discharge/Comments:  Patient to return home and follow up with community provider arranged by [facility].   Transportation Means:  Mother  Supports: Mother, sister, friends  Clide Dales

## 2012-08-25 NOTE — Discharge Summary (Signed)
Physician Discharge Summary Note  Patient:  James Meadows is an 22 y.o., male MRN:  [id number] DOB:  [birth year] Patient phone:  [phone] (home)  Patient address:   [address] [place] [address],   Date of Admission:  08/21/2012 Date of Discharge: 08/25/2012  Reason for Admission:  Overdose  Discharge Diagnoses: Active Problems:   Marijuana abuse   ADHD (attention deficit hyperactivity disorder)   Episodic mood disorder  Review of Systems  Constitutional: Negative.  Negative for fever, chills, weight loss, malaise/fatigue and diaphoresis.  HENT: Negative for congestion and sore throat.   Eyes: Negative for blurred vision, double vision and photophobia.  Respiratory: Negative for cough, shortness of breath and wheezing.   Cardiovascular: Negative for chest pain, palpitations and PND.  Gastrointestinal: Negative for heartburn, nausea, vomiting, abdominal pain, diarrhea and constipation.  Musculoskeletal: Negative for myalgias, joint pain and falls.  Neurological: Negative for dizziness, tingling, tremors, sensory change, speech change, focal weakness, seizures, loss of consciousness, weakness and headaches.  Endo/Heme/Allergies: Negative for polydipsia. Does not bruise/bleed easily.  Psychiatric/Behavioral: Negative for depression, suicidal ideas, hallucinations, memory loss and substance abuse. The patient is not nervous/anxious and does not have insomnia.   Discharge Diagnoses:  AXIS I: Mood Disorder NOS, ADHD, Marijuana Abuse  AXIS II: Deferred  AXIS III: History reviewed. No pertinent past medical history.  AXIS IV: problems related to legal system/crime  AXIS V: 61-70 mild symptoms   Level of Care:  OP Hospital Course:  James Meadows was admitted after he was brought to the ED by his mother, reporting that he had taken an overdose of someone's klonopin, smoked some weed and drank liquor. His blood alcohol level was <11 and his UDS was positive for benzodiazepines and THC.  Other blood work was unremarkable. James Meadows was given medical clearance and transferred to [facility] for further stabilization and treatment.      [name] was admitted to the substance abuse unit and evaluated by the treatment team. He had  Withdrawal symptoms and was not on a detox protocol.  He was given Trazodone for sleep at night and encouraged to participate in unit programming including AA/NA. His symptoms  Included difficulty concentrating, loss of energy/fatigue, disturbed sleep, weight loss, impulsivity, irritable mood and mood lability, excessive worry were his primary focus. He has a history of ADHD and marijuana abuse.      He denied SI/HI and reported no AVH. He refused to attend most groups, but did attend an one AA meeting. When he actually did attend groups he was irritable and resistant with little to no participation. He demanded to be discharged and stated he was not crazy and did not know why he was admitted. For most of his admission James Meadows was angry, irritable and resistant to programming. He minimalized his symptoms and rationalized his pending assault charges as justified.     James Meadows no longer met criteria for continued in patient hospitalization even though he continued to show limited insight and poor judgment.  He was referred to [facility] in [place] for follow up.  Consults:  None  Significant Diagnostic Studies:  None  Discharge Vitals:   Blood pressure 118/80, pulse 79, temperature 97.8 F (36.6 C), temperature source Oral, resp. rate 18, height 5\' 7"  (1.702 m), weight 63.05 kg (139 lb). Body mass index is 21.77 kg/(m^2). Lab Results:   No results found for this or any previous visit (from the past 72 hour(s)).  Physical Findings: AIMS: Facial and Oral Movements Muscles of  Facial Expression: None, normal Lips and Perioral Area: None, normal Jaw: None, normal Tongue: None, normal,Extremity Movements Upper (arms, wrists, hands, fingers): None, normal Lower  (legs, knees, ankles, toes): None, normal, Trunk Movements Neck, shoulders, hips: None, normal, Overall Severity Severity of abnormal movements (highest score from questions above): None, normal Incapacitation due to abnormal movements: None, normal Patient's awareness of abnormal movements (rate only patient's report): No Awareness, Dental Status Current problems with teeth and/or dentures?: No Does patient usually wear dentures?: No  CIWA:    COWS:     Psychiatric Specialty Exam: See Psychiatric Specialty Exam and Suicide Risk Assessment completed by Attending Physician prior to discharge.  Discharge destination:  Home  Is patient on multiple antipsychotic therapies at discharge:  No   Has Patient had three or more failed trials of antipsychotic monotherapy by history:  No  Recommended Plan for Multiple Antipsychotic Therapies: Not applicable   Discharge Orders   Future Orders Complete By Expires     Diet - low sodium heart healthy  As directed     Discharge instructions  As directed     Comments:      Take all of your medications as directed. Be sure to keep all of your follow up appointments.  If you are unable to keep your follow up appointment, call your Doctor's office to let them know, and reschedule.  Make sure that you have enough medication to last until your appointment. Be sure to get plenty of rest. Going to bed at the same time each night will help. Try to avoid sleeping during the day.  Increase your activity as tolerated. Regular exercise will help you to sleep better and improve your mental health. Eating a heart healthy diet is recommended. Try to avoid salty or fried foods. Be sure to avoid all alcohol and illegal drugs.    Increase activity slowly  As directed         Medication List    TAKE these medications     Indication   traZODone 100 MG tablet  Commonly known as:  DESYREL  Take 1 tablet (100 mg total) by mouth at bedtime as needed for sleep. For  insomnia.   Indication:  Trouble Sleeping, sleep      Patient to Follow up at:  Follow-up Information    Follow up with [facility] On 08/27/2012. (Appointment at 1:45 PM on Wednesday 08/27/12)    Contact information:    [address], [place], [place] [address] [facility] [phone] FAX [phone]        Follow-up recommendations:   Activities: Resume activity as tolerated. Diet: Heart healthy low sodium diet Tests: Follow up testing will be determined by your out patient provider. Continue to work your relapse prevention plan Comments:    Total Discharge Time:  Greater than 30 minutes.  Signed: Rona Ravens. Mashburn RPAC 7:58 AM 08/26/2012

## 2012-08-28 NOTE — Progress Notes (Signed)
Patient Discharge Instructions:  After Visit Summary (AVS):   Faxed to:  08/28/12 Discharge Summary Note:   Faxed to:  08/28/12 Psychiatric Admission Assessment Note:   Faxed to:  08/28/12 Suicide Risk Assessment - Discharge Assessment:   Faxed to:  08/28/12 Faxed/Sent to the Next Level Care provider:  08/28/12 Faxed to [facility] @ [phone]  Jerelene Redden, 08/28/2012, 3:24 PM

## 2012-11-15 ENCOUNTER — Emergency Department (HOSPITAL_COMMUNITY)
Admission: EM | Admit: 2012-11-15 | Discharge: 2012-11-15 | Disposition: A | Discharge status: Home / Self Care | Payer: Medicaid Other | Attending: Emergency Medicine | Admitting: Emergency Medicine

## 2012-11-15 ENCOUNTER — Encounter (HOSPITAL_COMMUNITY): Payer: Self-pay | Admitting: *Deleted

## 2012-11-15 DIAGNOSIS — R062 Wheezing: Secondary | ICD-10-CM | POA: Insufficient documentation

## 2012-11-15 DIAGNOSIS — R05 Cough: Secondary | ICD-10-CM | POA: Insufficient documentation

## 2012-11-15 DIAGNOSIS — F17200 Nicotine dependence, unspecified, uncomplicated: Secondary | ICD-10-CM | POA: Insufficient documentation

## 2012-11-15 DIAGNOSIS — J40 Bronchitis, not specified as acute or chronic: Secondary | ICD-10-CM | POA: Insufficient documentation

## 2012-11-15 DIAGNOSIS — R059 Cough, unspecified: Secondary | ICD-10-CM | POA: Insufficient documentation

## 2012-11-15 DIAGNOSIS — J3489 Other specified disorders of nose and nasal sinuses: Secondary | ICD-10-CM | POA: Insufficient documentation

## 2012-11-15 DIAGNOSIS — J029 Acute pharyngitis, unspecified: Secondary | ICD-10-CM | POA: Insufficient documentation

## 2012-11-15 DIAGNOSIS — R630 Anorexia: Secondary | ICD-10-CM | POA: Insufficient documentation

## 2012-11-15 DIAGNOSIS — R599 Enlarged lymph nodes, unspecified: Secondary | ICD-10-CM | POA: Insufficient documentation

## 2012-11-15 LAB — RAPID STREP SCREEN (MED CTR MEBANE ONLY): Streptococcus, Group A Screen (Direct): NEGATIVE

## 2012-11-15 MED ORDER — AZITHROMYCIN 250 MG PO TABS: 250.0000 mg | ORAL_TABLET | Freq: Every day | ORAL | Status: DC

## 2012-11-15 MED ORDER — ALBUTEROL SULFATE HFA 108 (90 BASE) MCG/ACT IN AERS: 1.0000 | INHALATION_SPRAY | Freq: Four times a day (QID) | RESPIRATORY_TRACT | Status: DC | PRN

## 2012-11-15 MED ORDER — GUAIFENESIN-CODEINE 100-10 MG/5ML PO SYRP: 5.0000 mL | ORAL_SOLUTION | Freq: Three times a day (TID) | ORAL | Status: DC | PRN

## 2012-11-15 NOTE — ED Provider Notes (Signed)
CSN: [id number]     Arrival date & time 11/15/12  1107 History     First MD Initiated Contact with Patient 11/15/12 1111     Chief Complaint  Patient presents with  . Sore Throat   (Consider location/radiation/quality/duration/timing/severity/associated sxs/prior Treatment) Patient is a 22 y.o. male presenting with pharyngitis. The history is provided by the patient.  Sore Throat This is a new problem. Episode onset: 2 days ago. The problem occurs constantly. The problem has been gradually worsening. Associated symptoms include anorexia, congestion, coughing and a sore throat. Pertinent negatives include no abdominal pain, headaches, nausea, neck pain, rash, swollen glands or vomiting. Fever: ? The symptoms are aggravated by eating, coughing and swallowing. He has tried nothing for the symptoms.    History reviewed. No pertinent past medical history. Past Surgical History  Procedure Laterality Date  . Wrist surgery     No family history on file. History  Substance Use Topics  . Smoking status: Current Every Day Smoker    Types: Cigarettes  . Smokeless tobacco: Not on file  . Alcohol Use: Yes     Comment: Occ    Review of Systems  Constitutional: Fever: ?  HENT: Positive for congestion and sore throat. Negative for ear pain and neck pain.   Respiratory: Positive for cough and wheezing. Negative for shortness of breath.   Gastrointestinal: Positive for anorexia. Negative for nausea, vomiting and abdominal pain.  Musculoskeletal: Negative for back pain.  Skin: Negative for rash.  Neurological: Negative for dizziness and headaches.  Psychiatric/Behavioral: The patient is not nervous/anxious.     Allergies  Oxycodone and Ibuprofen  Home Medications   Current Outpatient Rx  Name  Route  Sig  Dispense  Refill  . traZODone (DESYREL) 100 MG tablet   Oral   Take 1 tablet (100 mg total) by mouth at bedtime as needed for sleep. For insomnia.   30 tablet   0    BP 120/72   Pulse 78  Temp(Src) 98.2 F (36.8 C) (Oral)  Resp 16  SpO2 100% Physical Exam  Nursing note and vitals reviewed. Constitutional: He is oriented to person, place, and time. He appears well-developed and well-nourished. No distress.  HENT:  Head: Normocephalic.  Right Ear: Tympanic membrane normal.  Left Ear: Tympanic membrane normal.  Mouth/Throat: Uvula is midline and mucous membranes are normal. Posterior oropharyngeal erythema present.  Eyes: Conjunctivae and EOM are normal.  Neck: Normal range of motion. Neck supple.  Cardiovascular: Normal rate, regular rhythm and normal heart sounds.   Pulmonary/Chest: Effort normal. He has wheezes. He has no rales.  Abdominal: Soft. Bowel sounds are normal. There is no tenderness.  Musculoskeletal: Normal range of motion.  Lymphadenopathy:    He has cervical adenopathy.  Neurological: He is alert and oriented to person, place, and time. No cranial nerve deficit.  Skin: Skin is warm and dry.  Psychiatric: He has a normal mood and affect. His behavior is normal.    ED Course   Procedures  MDM  22 y.o. male with pharyngitis and bronchitis. Will treat with antibiotics and cough medication. He is to follow up with his PCP. He will return if symptoms worsen. Patient stable for discharge without out any immediate complications O2 Sat on R/A 100%.   Medication List    TAKE these medications       albuterol 108 (90 BASE) MCG/ACT inhaler  Commonly known as:  PROVENTIL HFA;VENTOLIN HFA  Inhale 1-2 puffs into the lungs every 6 (  six) hours as needed for wheezing.     azithromycin 250 MG tablet  Commonly known as:  ZITHROMAX  Take 1 tablet (250 mg total) by mouth daily. Take first 2 tablets together, then 1 every day until finished.     guaiFENesin-codeine 100-10 MG/5ML syrup  Commonly known as:  ROBITUSSIN AC  Take 5 mLs by mouth 3 (three) times daily as needed for cough.      ASK your doctor about these medications       traZODone 100 MG  tablet  Commonly known as:  DESYREL  Take 1 tablet (100 mg total) by mouth at bedtime as needed for sleep. For insomnia.         Janne Napoleon, [place] 11/15/12 1227

## 2012-11-15 NOTE — ED Notes (Signed)
Pt c/o sore throat, fever that started two days ago,

## 2012-11-15 NOTE — ED Notes (Signed)
Pt c/o sore throat, cough, and congestion x2 days. Pt is unsure if he's had a fever since symptoms began. Pt states it is painful to swallow.

## 2012-11-15 NOTE — ED Provider Notes (Signed)
Medical screening examination/treatment/procedure(s) were performed by non-physician practitioner and as supervising physician I was immediately available for consultation/collaboration.   Carleene Cooper III, MD 11/15/12 [phone]

## 2012-11-16 ENCOUNTER — Telehealth (HOSPITAL_COMMUNITY): Payer: Self-pay | Admitting: Emergency Medicine

## 2012-11-16 NOTE — ED Notes (Addendum)
Call from [facility] James Meadows previously reported strep cx -> gram negative rods was reported incorrectly should be No suspicious colonies.

## 2012-11-17 LAB — CULTURE, GROUP A STREP

## 2012-11-18 ENCOUNTER — Emergency Department (HOSPITAL_COMMUNITY)
Admission: EM | Admit: 2012-11-18 | Discharge: 2012-11-18 | Disposition: A | Discharge status: Home / Self Care | Payer: Medicaid Other | Attending: Emergency Medicine | Admitting: Emergency Medicine

## 2012-11-18 ENCOUNTER — Encounter (HOSPITAL_COMMUNITY): Payer: Self-pay | Admitting: *Deleted

## 2012-11-18 ENCOUNTER — Emergency Department (HOSPITAL_COMMUNITY): Payer: Medicaid Other

## 2012-11-18 DIAGNOSIS — M7989 Other specified soft tissue disorders: Secondary | ICD-10-CM | POA: Insufficient documentation

## 2012-11-18 DIAGNOSIS — Y929 Unspecified place or not applicable: Secondary | ICD-10-CM | POA: Insufficient documentation

## 2012-11-18 DIAGNOSIS — Z79899 Other long term (current) drug therapy: Secondary | ICD-10-CM | POA: Insufficient documentation

## 2012-11-18 DIAGNOSIS — R609 Edema, unspecified: Secondary | ICD-10-CM | POA: Insufficient documentation

## 2012-11-18 DIAGNOSIS — Y9389 Activity, other specified: Secondary | ICD-10-CM | POA: Insufficient documentation

## 2012-11-18 DIAGNOSIS — S62609A Fracture of unspecified phalanx of unspecified finger, initial encounter for closed fracture: Secondary | ICD-10-CM | POA: Insufficient documentation

## 2012-11-18 DIAGNOSIS — F17200 Nicotine dependence, unspecified, uncomplicated: Secondary | ICD-10-CM | POA: Insufficient documentation

## 2012-11-18 DIAGNOSIS — X503XXA Overexertion from repetitive movements, initial encounter: Secondary | ICD-10-CM | POA: Insufficient documentation

## 2012-11-18 MED ORDER — HYDROCODONE-ACETAMINOPHEN 5-325 MG PO TABS: ORAL_TABLET | ORAL | Status: DC

## 2012-11-18 NOTE — ED Provider Notes (Signed)
CSN: [id number]     Arrival date & time 11/18/12  1458 History     First MD Initiated Contact with Patient 11/18/12 1516     Chief Complaint  Patient presents with  . Finger Injury   (Consider location/radiation/quality/duration/timing/severity/associated sxs/prior Treatment) HPI Comments: Patient c/o pain and swelling to the left ring finger that occurred from a hyperextension injury of the finger.  Patient reports pain with bending the finger or fully extending it.  He denies numbness or wrist pain.  He has not tried any therapies at home or taken any medication for pain.    Patient is a 22 y.o. male presenting with hand pain. The history is provided by the patient.  Hand Pain This is a new problem. Episode onset: just PTA. The problem occurs constantly. The problem has been unchanged. Associated symptoms include arthralgias and joint swelling. Pertinent negatives include no chills, fever, headaches, myalgias, nausea, numbness, rash, vomiting or weakness. The symptoms are aggravated by bending. He has tried nothing for the symptoms. The treatment provided no relief.    History reviewed. No pertinent past medical history. Past Surgical History  Procedure Laterality Date  . Wrist surgery     No family history on file. History  Substance Use Topics  . Smoking status: Current Every Day Smoker    Types: Cigarettes  . Smokeless tobacco: Not on file  . Alcohol Use: Yes     Comment: Occ    Review of Systems  Constitutional: Negative for fever and chills.  Gastrointestinal: Negative for nausea and vomiting.  Genitourinary: Negative for dysuria and difficulty urinating.  Musculoskeletal: Positive for joint swelling and arthralgias. Negative for myalgias.  Skin: Negative for color change, rash and wound.  Neurological: Negative for weakness, numbness and headaches.  All other systems reviewed and are negative.    Allergies  Oxycodone and Ibuprofen  Home Medications   Current  Outpatient Rx  Name  Route  Sig  Dispense  Refill  . albuterol (PROVENTIL HFA;VENTOLIN HFA) 108 (90 BASE) MCG/ACT inhaler   Inhalation   Inhale 1-2 puffs into the lungs every 6 (six) hours as needed for wheezing.   1 Inhaler   0   . azithromycin (ZITHROMAX) 250 MG tablet   Oral   Take 1 tablet (250 mg total) by mouth daily. Take first 2 tablets together, then 1 every day until finished.   6 tablet   0   . guaiFENesin-codeine (ROBITUSSIN AC) 100-10 MG/5ML syrup   Oral   Take 5 mLs by mouth 3 (three) times daily as needed for cough.   120 mL   0   . traZODone (DESYREL) 100 MG tablet   Oral   Take 1 tablet (100 mg total) by mouth at bedtime as needed for sleep. For insomnia.   30 tablet   0    BP 118/68  Pulse 76  Temp(Src) 98.2 F (36.8 C) (Oral)  Resp 14  Ht 5\' 6"  (1.676 m)  Wt 140 lb (63.504 kg)  BMI 22.61 kg/m2  SpO2 100% Physical Exam  Nursing note and vitals reviewed. Constitutional: He is oriented to person, place, and time. He appears well-developed and well-nourished. No distress.  HENT:  Head: Normocephalic and atraumatic.  Cardiovascular: Normal rate, regular rhythm, normal heart sounds and intact distal pulses.   No murmur heard. Pulmonary/Chest: Effort normal and breath sounds normal. No respiratory distress.  Musculoskeletal: He exhibits edema and tenderness.       Left hand: He exhibits decreased range  of motion, tenderness, bony tenderness and swelling. He exhibits normal two-point discrimination, normal capillary refill, no deformity and no laceration. Normal sensation noted. Normal strength noted.       Hands: ttp of the PIP joint of the left ring finger.  No nail injury or laceration.  .  Radial pulse is brisk, distal sensation intact.  CR< 2 sec.  No bruising or deformity.    Neurological: He is alert and oriented to person, place, and time. He exhibits normal muscle tone. Coordination normal.  Skin: Skin is warm and dry.    ED Course    Procedures (including critical care time)  Labs Reviewed - No data to display Dg Finger Middle Left  11/18/2012   *RADIOLOGY REPORT*  Clinical Data: Pain post trauma  LEFT MIDDLE FINGER 2+V  Comparison: October 24, 2010  Findings: Frontal, oblique, and lateral views were obtained.  There is a tiny avulsion arising from the volar aspect of the distal portion of the third proximal phalanx.  There is soft tissue swelling this area.  No other fracture.  No dislocation.  Joint spaces appear intact.  IMPRESSION:   Tiny avulsion arising from the volar aspect of the distal portion of the third proximal phalanx.  Soft tissue swelling is seen in this area.  No other fracture.   Original Report Authenticated By: Bretta Bang, M.D.     MDM    Left middle finger is splinted, pain improved, remains NV intact.  Pt advised to elevate, ice and keep finger splinted until he follows up with orthopedics, referral for Dr. Hilda Lias given  VSS.  Pt appears stable for discharge.    Idali Lafever L. Trisha Mangle, PA-C 11/20/12 2217

## 2012-11-18 NOTE — ED Notes (Signed)
Injury to left middle finger while playing with kids.  C/o pain and swelling.

## 2012-11-25 NOTE — ED Provider Notes (Signed)
Medical screening examination/treatment/procedure(s) were performed by non-physician practitioner and as supervising physician I was immediately available for consultation/collaboration.   Laray Anger, DO 11/25/12 1155

## 2013-04-30 ENCOUNTER — Emergency Department (HOSPITAL_COMMUNITY)
Admission: EM | Admit: 2013-04-30 | Discharge: 2013-04-30 | Disposition: A | Discharge status: Home / Self Care | Payer: Medicaid Other | Attending: Emergency Medicine | Admitting: Emergency Medicine

## 2013-04-30 ENCOUNTER — Encounter (HOSPITAL_COMMUNITY): Payer: Self-pay | Admitting: Emergency Medicine

## 2013-04-30 DIAGNOSIS — R5383 Other fatigue: Secondary | ICD-10-CM

## 2013-04-30 DIAGNOSIS — Z79899 Other long term (current) drug therapy: Secondary | ICD-10-CM | POA: Insufficient documentation

## 2013-04-30 DIAGNOSIS — Z792 Long term (current) use of antibiotics: Secondary | ICD-10-CM | POA: Insufficient documentation

## 2013-04-30 DIAGNOSIS — J029 Acute pharyngitis, unspecified: Secondary | ICD-10-CM | POA: Insufficient documentation

## 2013-04-30 DIAGNOSIS — B349 Viral infection, unspecified: Secondary | ICD-10-CM

## 2013-04-30 DIAGNOSIS — R112 Nausea with vomiting, unspecified: Secondary | ICD-10-CM | POA: Insufficient documentation

## 2013-04-30 DIAGNOSIS — F17200 Nicotine dependence, unspecified, uncomplicated: Secondary | ICD-10-CM | POA: Insufficient documentation

## 2013-04-30 DIAGNOSIS — J111 Influenza due to unidentified influenza virus with other respiratory manifestations: Secondary | ICD-10-CM

## 2013-04-30 DIAGNOSIS — B9789 Other viral agents as the cause of diseases classified elsewhere: Secondary | ICD-10-CM | POA: Insufficient documentation

## 2013-04-30 DIAGNOSIS — R5381 Other malaise: Secondary | ICD-10-CM | POA: Insufficient documentation

## 2013-04-30 DIAGNOSIS — IMO0001 Reserved for inherently not codable concepts without codable children: Secondary | ICD-10-CM | POA: Insufficient documentation

## 2013-04-30 MED ORDER — TRAMADOL HCL 50 MG PO TABS: 50.0000 mg | ORAL_TABLET | Freq: Four times a day (QID) | ORAL | Status: DC | PRN

## 2013-04-30 MED ORDER — ONDANSETRON 4 MG PO TBDP: 4.0000 mg | ORAL_TABLET | Freq: Three times a day (TID) | ORAL | Status: DC | PRN

## 2013-04-30 MED ORDER — ONDANSETRON 4 MG PO TBDP
4.0000 mg | ORAL_TABLET | Freq: Once | ORAL | Status: AC
  Administered 2013-04-30: 4 mg via ORAL
  Filled 2013-04-30: qty 1

## 2013-04-30 MED ORDER — HYDROCODONE-ACETAMINOPHEN 5-325 MG PO TABS
1.0000 | ORAL_TABLET | Freq: Once | ORAL | Status: AC
  Administered 2013-04-30: 1 via ORAL
  Filled 2013-04-30: qty 1

## 2013-04-30 NOTE — ED Provider Notes (Signed)
CSN: [id number]     Arrival date & time 04/30/13  0865 History  This chart was scribed for Rolland Porter, MD by Quintella Reichert, ED scribe.  This patient was seen in room APA03/APA03 and the patient's care was started at 7:25 AM.   Chief Complaint  Patient presents with  . Generalized Body Aches  . Sore Throat  . Emesis    The history is provided by the patient. No language interpreter was used.    HPI Comments: James Meadows is a 23 y.o. male who presents to the Emergency Department complaining of cough and body aches and vomiting for last 2-3 days. He presents here after waking this morning. Since his whole-body hurts. Urinating normally. His nausea and vomiting. No diarrhea. Has a sore throat when he coughs and has somewhat of a cough. Does not feel short of breath. Mild headache. No neck pain or stiffness.   History reviewed. No pertinent past medical history.   Past Surgical History  Procedure Laterality Date  . Wrist surgery      History reviewed. No pertinent family history.   History  Substance Use Topics  . Smoking status: Current Every Day Smoker    Types: Cigarettes  . Smokeless tobacco: Not on file  . Alcohol Use: Yes     Comment: Occ     Review of Systems  Constitutional: Positive for fever and fatigue. Negative for chills, diaphoresis and appetite change.  HENT: Negative for mouth sores, sore throat and trouble swallowing.   Eyes: Negative for visual disturbance.  Respiratory: Positive for cough. Negative for chest tightness, shortness of breath and wheezing.   Cardiovascular: Negative for chest pain.  Gastrointestinal: Positive for nausea and vomiting. Negative for abdominal pain, diarrhea and abdominal distention.  Endocrine: Negative for polydipsia, polyphagia and polyuria.  Genitourinary: Negative for dysuria, frequency and hematuria.  Musculoskeletal: Positive for myalgias. Negative for gait problem and neck pain.  Skin: Negative for color change,  pallor and rash.  Neurological: Positive for headaches. Negative for dizziness, syncope and light-headedness.  Hematological: Does not bruise/bleed easily.  Psychiatric/Behavioral: Negative for behavioral problems and confusion.     Allergies  Oxycodone and Ibuprofen  Home Medications   Current Outpatient Rx  Name  Route  Sig  Dispense  Refill  . traZODone (DESYREL) 100 MG tablet   Oral   Take 1 tablet (100 mg total) by mouth at bedtime as needed for sleep. For insomnia.   30 tablet   0   . albuterol (PROVENTIL HFA;VENTOLIN HFA) 108 (90 BASE) MCG/ACT inhaler   Inhalation   Inhale 1-2 puffs into the lungs every 6 (six) hours as needed for wheezing.   1 Inhaler   0   . azithromycin (ZITHROMAX) 250 MG tablet   Oral   Take 1 tablet (250 mg total) by mouth daily. Take first 2 tablets together, then 1 every day until finished.   6 tablet   0   . guaiFENesin-codeine (ROBITUSSIN AC) 100-10 MG/5ML syrup   Oral   Take 5 mLs by mouth 3 (three) times daily as needed for cough.   120 mL   0   . HYDROcodone-acetaminophen (NORCO/VICODIN) 5-325 MG per tablet      Take one-two tabs po q 4-6 hrs prn pain   20 tablet   0   . ondansetron (ZOFRAN ODT) 4 MG disintegrating tablet   Oral   Take 1 tablet (4 mg total) by mouth every 8 (eight) hours as needed for nausea.  20 tablet   0   . traMADol (ULTRAM) 50 MG tablet   Oral   Take 1 tablet (50 mg total) by mouth every 6 (six) hours as needed.   8 tablet   0    BP 126/73  Pulse 96  Temp(Src) 98.7 F (37.1 C) (Oral)  Resp 20  Ht 5\' 7"  (1.702 m)  Wt 148 lb (67.132 kg)  BMI 23.17 kg/m2  SpO2 100%  Physical Exam  Nursing note and vitals reviewed. Constitutional: He is oriented to person, place, and time. He appears well-developed and well-nourished. No distress.  HENT:  Head: Normocephalic.  No pharyngitis with exudate or tonsilllar changes.  Eyes: Conjunctivae are normal. Pupils are equal, round, and reactive to light.  No scleral icterus.  Neck: Normal range of motion. Neck supple. No thyromegaly present.  Cardiovascular: Normal rate and regular rhythm.  Exam reveals no gallop and no friction rub.   No murmur heard. Pulmonary/Chest: Effort normal and breath sounds normal. No respiratory distress. He has no wheezes. He has no rales.  Abdominal: Soft. Bowel sounds are normal. He exhibits no distension. There is no tenderness. There is no rebound.  Musculoskeletal: Normal range of motion.  Neurological: He is alert and oriented to person, place, and time.  Skin: Skin is warm and dry. No rash noted.  Psychiatric: He has a normal mood and affect. His behavior is normal.    ED Course  Procedures (including critical care time)  DIAGNOSTIC STUDIES: Oxygen Saturation is 100% on room air, normal by my interpretation.    COORDINATION OF CARE:    Labs Review Labs Reviewed - No data to display  Imaging Review No results found.  EKG Interpretation   None       MDM   1. Viral syndrome   2. Influenza    Afebrile here. Not hypoxemic. No pharyngitis clinically to suggest strep. Clear lungs no abnormal breath sounds that would suggest pneumonia. Symptoms consistent with a viral syndrome and likely influenza. Plan will be symptomatic treatment.      Rolland PorterMark Lamount Bankson, MD 04/30/13 [phone]

## 2013-04-30 NOTE — Discharge Instructions (Signed)
Viral Infections A viral infection can be caused by different types of viruses.Most viral infections are not serious and resolve on their own. However, some infections may cause severe symptoms and may lead to further complications. SYMPTOMS Viruses can frequently cause:  Minor sore throat.  Aches and pains.  Headaches.  Runny nose.  Different types of rashes.  Watery eyes.  Tiredness.  Cough.  Loss of appetite.  Gastrointestinal infections, resulting in nausea, vomiting, and diarrhea. These symptoms do not respond to antibiotics because the infection is not caused by bacteria. However, you might catch a bacterial infection following the viral infection. This is sometimes called a "superinfection." Symptoms of such a bacterial infection may include:  Worsening sore throat with pus and difficulty swallowing.  Swollen neck glands.  Chills and a high or persistent fever.  Severe headache.  Tenderness over the sinuses.  Persistent overall ill feeling (malaise), muscle aches, and tiredness (fatigue).  Persistent cough.  Yellow, green, or brown mucus production with coughing. HOME CARE INSTRUCTIONS   Only take over-the-counter or prescription medicines for pain, discomfort, diarrhea, or fever as directed by your caregiver.  Drink enough water and fluids to keep your urine clear or pale yellow. Sports drinks can provide valuable electrolytes, sugars, and hydration.  Get plenty of rest and maintain proper nutrition. Soups and broths with crackers or rice are fine. SEEK IMMEDIATE MEDICAL CARE IF:   You have severe headaches, shortness of breath, chest pain, neck pain, or an unusual rash.  You have uncontrolled vomiting, diarrhea, or you are unable to keep down fluids.  You or your child has an oral temperature above 102 F (38.9 C), not controlled by medicine.  Your baby is older than 3 months with a rectal temperature of 102 F (38.9 C) or higher.  Your baby is 313  months old or younger with a rectal temperature of 100.4 F (38 C) or higher. MAKE SURE YOU:   Understand these instructions.  Will watch your condition.  Will get help right away if you are not doing well or get worse. Document Released: 01/17/2005 Document Revised: 07/02/2011 Document Reviewed: 08/14/2010 [facility] Patient Information 2014 [place], [place].  Influenza, Adult Influenza ("the flu") is a viral infection of the respiratory tract. It occurs more often in winter months because people spend more time in close contact with one another. Influenza can make you feel very sick. Influenza easily spreads from person to person (contagious). CAUSES  Influenza is caused by a virus that infects the respiratory tract. You can catch the virus by breathing in droplets from an infected person's cough or sneeze. You can also catch the virus by touching something that was recently contaminated with the virus and then touching your mouth, nose, or eyes. SYMPTOMS  Symptoms typically last 4 to 10 days and may include:  Fever.  Chills.  Headache, body aches, and muscle aches.  Sore throat.  Chest discomfort and cough.  Poor appetite.  Weakness or feeling tired.  Dizziness.  Nausea or vomiting. DIAGNOSIS  Diagnosis of influenza is often made based on your history and a physical exam. A nose or throat swab test can be done to confirm the diagnosis. RISKS AND COMPLICATIONS You may be at risk for a more severe case of influenza if you smoke cigarettes, have diabetes, have chronic heart disease (such as heart failure) or lung disease (such as asthma), or if you have a weakened immune system. Elderly people and pregnant women are also at risk for more serious  infections. The most common complication of influenza is a lung infection (pneumonia). Sometimes, this complication can require emergency medical care and may be life-threatening. PREVENTION  An annual influenza vaccination (flu shot) is  the best way to avoid getting influenza. An annual flu shot is now routinely recommended for all adults in the [place] TREATMENT  In mild cases, influenza goes away on its own. Treatment is directed at relieving symptoms. For more severe cases, your caregiver may prescribe antiviral medicines to shorten the sickness. Antibiotic medicines are not effective, because the infection is caused by a virus, not by bacteria. HOME CARE INSTRUCTIONS  Only take over-the-counter or prescription medicines for pain, discomfort, or fever as directed by your caregiver.  Use a cool mist humidifier to make breathing easier.  Get plenty of rest until your temperature returns to normal. This usually takes 3 to 4 days.  Drink enough fluids to keep your urine clear or pale yellow.  Cover your mouth and nose when coughing or sneezing, and wash your hands well to avoid spreading the virus.  Stay home from work or school until your fever has been gone for at least 1 full day. SEEK MEDICAL CARE IF:   You have chest pain or a deep cough that worsens or produces more mucus.  You have nausea, vomiting, or diarrhea. SEEK IMMEDIATE MEDICAL CARE IF:   You have difficulty breathing, shortness of breath, or your skin or nails turn bluish.  You have severe neck pain or stiffness.  You have a severe headache, facial pain, or earache.  You have a worsening or recurring fever.  You have nausea or vomiting that cannot be controlled. MAKE SURE YOU:  Understand these instructions.  Will watch your condition.  Will get help right away if you are not doing well or get worse. Document Released: 04/06/2000 Document Revised: 10/09/2011 Document Reviewed: 07/09/2011 [facility] Patient Information 2014 [place], [place].

## 2013-05-07 ENCOUNTER — Emergency Department (HOSPITAL_COMMUNITY): Payer: Medicaid Other

## 2013-05-07 ENCOUNTER — Encounter (HOSPITAL_COMMUNITY): Payer: Self-pay | Admitting: Emergency Medicine

## 2013-05-07 ENCOUNTER — Emergency Department (HOSPITAL_COMMUNITY)
Admission: EM | Admit: 2013-05-07 | Discharge: 2013-05-07 | Disposition: A | Discharge status: Home / Self Care | Payer: Medicaid Other | Attending: Emergency Medicine | Admitting: Emergency Medicine

## 2013-05-07 DIAGNOSIS — S63509A Unspecified sprain of unspecified wrist, initial encounter: Secondary | ICD-10-CM | POA: Insufficient documentation

## 2013-05-07 DIAGNOSIS — S63501A Unspecified sprain of right wrist, initial encounter: Secondary | ICD-10-CM

## 2013-05-07 DIAGNOSIS — F17200 Nicotine dependence, unspecified, uncomplicated: Secondary | ICD-10-CM | POA: Insufficient documentation

## 2013-05-07 DIAGNOSIS — J029 Acute pharyngitis, unspecified: Secondary | ICD-10-CM | POA: Insufficient documentation

## 2013-05-07 MED ORDER — ACETAMINOPHEN 325 MG PO TABS
650.0000 mg | ORAL_TABLET | Freq: Once | ORAL | Status: AC
  Administered 2013-05-07: 650 mg via ORAL
  Filled 2013-05-07: qty 2
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
## 2013-05-07 NOTE — Discharge Instructions (Signed)
Recommend a wrist brace and ice to the affected area for symptoms. You may take Tylenol as needed for pain. Recommend Chloraseptic spray for your sore throat which can be found over-the-counter your local pharmacy as well as Tylenol for discomfort. Use salt water gargles 3-4 times per day. Followup with your primary care doctor.  Viral Pharyngitis Viral pharyngitis is a viral infection that produces redness, pain, and swelling (inflammation) of the throat. It can spread from person to person (contagious). CAUSES Viral pharyngitis is caused by inhaling a large amount of certain germs called viruses. Many different viruses cause viral pharyngitis. SYMPTOMS Symptoms of viral pharyngitis include:  Sore throat.  Tiredness.  Stuffy nose.  Low-grade fever.  Congestion.  Cough. TREATMENT Treatment includes rest, drinking plenty of fluids, and the use of over-the-counter medication (approved by your caregiver). HOME CARE INSTRUCTIONS   Drink enough fluids to keep your urine clear or pale yellow.  Eat soft, cold foods such as ice cream, frozen ice pops, or gelatin dessert.  Gargle with warm salt water (1 tsp salt per 1 qt of water).  If over age 517, throat lozenges may be used safely.  Only take over-the-counter or prescription medicines for pain, discomfort, or fever as directed by your caregiver. Do not take aspirin. To help prevent spreading viral pharyngitis to others, avoid:  Mouth-to-mouth contact with others.  Sharing utensils for eating and drinking.  Coughing around others. SEEK MEDICAL CARE IF:   You are better in a few days, then become worse.  You have a fever or pain not helped by pain medicines.  There are any other changes that concern you. Document Released: 01/17/2005 Document Revised: 07/02/2011 Document Reviewed: 06/15/2010 [facility] Patient Information 2014 [place], [place]. Salt Water Gargle This solution will help make your mouth and throat feel  better. HOME CARE INSTRUCTIONS   Mix 1 teaspoon of salt in 8 ounces of warm water.  Gargle with this solution as much or often as you need or as directed. Swish and gargle gently if you have any sores or wounds in your mouth.  Do not swallow this mixture. Document Released: 01/12/2004 Document Revised: 07/02/2011 Document Reviewed: 06/04/2008 [facility] Center[facility] Patient Information 2014 [place]Exit[place], [place]. RICE: Routine Care for Injuries The routine care of many injuries includes Rest, Ice, Compression, and Elevation (RICE). HOME CARE INSTRUCTIONS  Rest is needed to allow your body to heal. Routine activities can usually be resumed when comfortable. Injured tendons and bones can take up to 6 weeks to heal. Tendons are the cord-like structures that attach muscle to bone.  Ice following an injury helps keep the swelling down and reduces pain.  Put ice in a plastic bag.  Place a towel between your skin and the bag.  Leave the ice on for 15-20 minutes, 03-04 times a day. Do this while awake, for the first 24 to 48 hours. After that, continue as directed by your caregiver.  Compression helps keep swelling down. It also gives support and helps with discomfort. If an elastic bandage has been applied, it should be removed and reapplied every 3 to 4 hours. It should not be applied tightly, but firmly enough to keep swelling down. Watch fingers or toes for swelling, bluish discoloration, coldness, numbness, or excessive pain. If any of these problems occur, remove the bandage and reapply loosely. Contact your caregiver if these problems continue.  Elevation helps reduce swelling and decreases pain. With extremities, such as the arms, hands, legs, and feet, the injured area should be placed  near or above the level of the heart, if possible. SEEK IMMEDIATE MEDICAL CARE IF:  You have persistent pain and swelling.  You develop redness, numbness, or unexpected weakness.  Your symptoms are getting worse rather  than improving after several days. These symptoms may indicate that further evaluation or further X-rays are needed. Sometimes, X-rays may not show a small broken bone (fracture) until 1 week or 10 days later. Make a follow-up appointment with your caregiver. Ask when your X-ray results will be ready. Make sure you get your X-ray results. Document Released: 07/22/2000 Document Revised: 07/02/2011 Document Reviewed: 09/08/2010 [facility] Patient Information 2014 [place], [place].

## 2013-05-07 NOTE — ED Provider Notes (Signed)
Medical screening examination/treatment/procedure(s) were performed by non-physician practitioner and as supervising physician I was immediately available for consultation/collaboration.  EKG Interpretation   None         Joya Gaskinsonald W Demetrio Leighty, MD 05/07/13 1356
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
## 2014-01-18 ENCOUNTER — Encounter (HOSPITAL_COMMUNITY): Payer: Self-pay | Admitting: Emergency Medicine

## 2014-01-18 ENCOUNTER — Emergency Department (HOSPITAL_COMMUNITY)
Admission: EM | Admit: 2014-01-18 | Discharge: 2014-01-18 | Disposition: A | Discharge status: Home / Self Care | Payer: Medicaid Other | Attending: Emergency Medicine | Admitting: Emergency Medicine

## 2014-01-18 DIAGNOSIS — Z202 Contact with and (suspected) exposure to infections with a predominantly sexual mode of transmission: Secondary | ICD-10-CM | POA: Diagnosis present

## 2014-01-18 DIAGNOSIS — F17200 Nicotine dependence, unspecified, uncomplicated: Secondary | ICD-10-CM | POA: Insufficient documentation

## 2014-01-18 DIAGNOSIS — R369 Urethral discharge, unspecified: Secondary | ICD-10-CM | POA: Diagnosis not present

## 2014-01-18 DIAGNOSIS — R300 Dysuria: Secondary | ICD-10-CM | POA: Diagnosis not present

## 2014-01-18 LAB — HIV ANTIBODY (ROUTINE TESTING W REFLEX): HIV 1&2 Ab, 4th Generation: NONREACTIVE

## 2014-01-18 LAB — RPR

## 2014-01-18 MED ORDER — AZITHROMYCIN 250 MG PO TABS
1000.0000 mg | ORAL_TABLET | Freq: Once | ORAL | Status: AC
  Administered 2014-01-18: 1000 mg via ORAL
  Filled 2014-01-18: qty 4

## 2014-01-18 MED ORDER — CEFTRIAXONE SODIUM 250 MG IJ SOLR
250.0000 mg | INTRAMUSCULAR | Status: DC
  Administered 2014-01-18: 250 mg via INTRAMUSCULAR
  Filled 2014-01-18: qty 250

## 2014-01-18 MED ORDER — LIDOCAINE HCL (PF) 1 % IJ SOLN
INTRAMUSCULAR | Status: AC
  Administered 2014-01-18: 10:00:00
  Filled 2014-01-18: qty 5

## 2014-01-18 NOTE — Discharge Instructions (Signed)
You have been treated for gonorrhea and Chlamydia which are the 2 most likely possible infections given your symptoms.  You need to avoid sex for the next 7 days or until your symptoms are gone.  Get rechecked for any persistent symptoms.  You will be notified if your cultures or blood tests are positive.

## 2014-01-18 NOTE — ED Provider Notes (Signed)
CSN: [id number]     Arrival date & time 01/18/14  4098 History   First MD Initiated Contact with Patient 01/18/14 [phone]     Chief Complaint  Patient presents with  . SEXUALLY TRANSMITTED DISEASE     (Consider location/radiation/quality/duration/timing/severity/associated sxs/prior Treatment) The history is provided by the patient.   James Meadows is a 23 y.o. male presenting with green penile discharge along with burning with urination which started this morning.  He denies fevers, chills, nausea, vomiting, back or abdominal pain.  He was sexually active one month ago after being abstinent for 3 months.  He received a call from his partner about 2 weeks ago telling him he needed to get "checked", but since he didn't have symptoms,  He opted for watchful waiting.  He does not use condoms and denies any other sexual activity since this last exposure.     History reviewed. No pertinent past medical history. Past Surgical History  Procedure Laterality Date  . Wrist surgery     No family history on file. History  Substance Use Topics  . Smoking status: Current Every Day Smoker    Types: Cigarettes  . Smokeless tobacco: Not on file  . Alcohol Use: Yes     Comment: Occ    Review of Systems  Constitutional: Negative for fever.  HENT: Negative for congestion and sore throat.   Eyes: Negative.   Respiratory: Negative for chest tightness and shortness of breath.   Cardiovascular: Negative for chest pain.  Gastrointestinal: Negative for nausea and abdominal pain.  Genitourinary: Positive for dysuria and discharge. Negative for frequency, flank pain and penile swelling.  Musculoskeletal: Negative for arthralgias, back pain, joint swelling and neck pain.  Skin: Negative.  Negative for rash and wound.  Neurological: Negative for dizziness, weakness, light-headedness, numbness and headaches.  Psychiatric/Behavioral: Negative.       Allergies  Oxycodone and Ibuprofen  Home  Medications   Prior to Admission medications   Not on File   BP 123/84  Pulse 74  Temp(Src) 98.9 F (37.2 C) (Oral)  Resp 16  SpO2 100% Physical Exam  Nursing note and vitals reviewed. Constitutional: He appears well-developed and well-nourished.  HENT:  Head: Normocephalic and atraumatic.  Eyes: Conjunctivae are normal.  Cardiovascular: Normal rate.   Pulmonary/Chest: Effort normal. No respiratory distress.  Abdominal: Soft. Bowel sounds are normal. He exhibits no distension. There is no tenderness.  Genitourinary: Circumcised. Discharge found.  Musculoskeletal: Normal range of motion.  Neurological: He is alert.  Skin: Skin is warm and dry.  Psychiatric: He has a normal mood and affect.    ED Course  Procedures (including critical care time) Labs Review Labs Reviewed  GC/CHLAMYDIA PROBE AMP  RPR  HIV ANTIBODY (ROUTINE TESTING)    Imaging Review No results found.   EKG Interpretation None      MDM   Final diagnoses:  Penile discharge    Cultures obtained including GC Chlamydia syphilis HIV.  Patient was advised that these cultures are pending and he will be notified if symptoms are positive.  He was treated for gonorrhea and Chlamydia with Rocephin IM and Zithromax by mouth times one.  Advised abstinence x1 week or until symptoms are resolved.  Referral to the health department if he has persistent symptoms.  Counseled regarding safe sex practices.    Burgess Amor, PA-C 01/18/14 [phone]

## 2014-01-18 NOTE — ED Notes (Signed)
Green discharge from penis that began this am. Denies any pain with symptoms. Denies multiple sexual partners

## 2014-01-19 LAB — GC/CHLAMYDIA PROBE AMP
CT Probe RNA: POSITIVE — AB
GC Probe RNA: POSITIVE — AB

## 2014-01-19 NOTE — ED Provider Notes (Signed)
Medical screening examination/treatment/procedure(s) were performed by non-physician practitioner and as supervising physician I was immediately available for consultation/collaboration.   EKG Interpretation None        Adrieana Fennelly W Mihir Flanigan, MD 01/19/14 1200 

## 2014-01-22 ENCOUNTER — Telehealth (HOSPITAL_COMMUNITY): Payer: Self-pay

## 2014-04-24 ENCOUNTER — Emergency Department (HOSPITAL_COMMUNITY)
Admission: EM | Admit: 2014-04-24 | Discharge: 2014-04-24 | Disposition: A | Discharge status: Home / Self Care | Payer: Medicaid Other | Attending: Emergency Medicine | Admitting: Emergency Medicine

## 2014-04-24 ENCOUNTER — Encounter (HOSPITAL_COMMUNITY): Payer: Self-pay | Admitting: Emergency Medicine

## 2014-04-24 DIAGNOSIS — Z202 Contact with and (suspected) exposure to infections with a predominantly sexual mode of transmission: Secondary | ICD-10-CM | POA: Diagnosis not present

## 2014-04-24 DIAGNOSIS — Z720 Tobacco use: Secondary | ICD-10-CM | POA: Insufficient documentation

## 2014-04-24 DIAGNOSIS — Z91040 Latex allergy status: Secondary | ICD-10-CM | POA: Insufficient documentation

## 2014-04-24 LAB — URINALYSIS, ROUTINE W REFLEX MICROSCOPIC
Bilirubin Urine: NEGATIVE
Glucose, UA: NEGATIVE mg/dL
Ketones, ur: NEGATIVE mg/dL
NITRITE: NEGATIVE
PH: 6.5 (ref 5.0–8.0)
Protein, ur: NEGATIVE mg/dL
Specific Gravity, Urine: 1.025 (ref 1.005–1.030)
UROBILINOGEN UA: 0.2 mg/dL (ref 0.0–1.0)

## 2014-04-24 LAB — URINE MICROSCOPIC-ADD ON

## 2014-04-24 MED ORDER — STERILE WATER FOR INJECTION IJ SOLN
INTRAMUSCULAR | Status: AC
  Administered 2014-04-24: 2.1 mL
  Filled 2014-04-24: qty 10

## 2014-04-24 MED ORDER — AZITHROMYCIN 250 MG PO TABS
1000.0000 mg | ORAL_TABLET | Freq: Once | ORAL | Status: AC
  Administered 2014-04-24: 1000 mg via ORAL
  Filled 2014-04-24: qty 4

## 2014-04-24 MED ORDER — CEFTRIAXONE SODIUM 250 MG IJ SOLR
250.0000 mg | Freq: Once | INTRAMUSCULAR | Status: AC
  Administered 2014-04-24: 250 mg via INTRAMUSCULAR
  Filled 2014-04-24: qty 250

## 2014-04-24 NOTE — ED Notes (Signed)
Patient was contacted today by previous partner stating they had gonorrhea and he needed to be checked. Per patient last had intercourse on Christmas-unprotected. Patient denies any discharge, pain, odor, or lesions.

## 2014-04-24 NOTE — Discharge Instructions (Signed)
Safe Sex Safe sex is about reducing the risk of giving or getting a sexually transmitted disease (STD). STDs are spread through sexual contact involving the genitals, mouth, or rectum. Some STDs can be cured and others cannot. Safe sex can also prevent unintended pregnancies.  WHAT ARE SOME SAFE SEX PRACTICES?  Limit your sexual activity to only one partner who is having sex with only you.  Talk to your partner about his or her past partners, past STDs, and drug use.  Use a condom every time you have sexual intercourse. This includes vaginal, oral, and anal sexual activity. Both females and males should wear condoms during oral sex. Only use latex or polyurethane condoms and water-based lubricants. Using petroleum-based lubricants or oils to lubricate a condom will weaken the condom and increase the chance that it will break. The condom should be in place from the beginning to the end of sexual activity. Wearing a condom reduces, but does not completely eliminate, your risk of getting or giving an STD. STDs can be spread by contact with infected body fluids and skin.  Get vaccinated for hepatitis B and HPV.  Avoid alcohol and recreational drugs, which can affect your judgment. You may forget to use a condom or participate in high-risk sex.  For females, avoid douching after sexual intercourse. Douching can spread an infection farther into the reproductive tract.  Check your body for signs of sores, blisters, rashes, or unusual discharge. See your health care provider if you notice any of these signs.  Avoid sexual contact if you have symptoms of an infection or are being treated for an STD. If you or your partner has herpes, avoid sexual contact when blisters are present. Use condoms at all other times.  If you are at risk of being infected with HIV, it is recommended that you take a prescription medicine daily to prevent HIV infection. This is called pre-exposure prophylaxis (PrEP). You are  considered at risk if:  You are a man who has sex with other men (MSM).  You are a heterosexual man or woman who is sexually active with more than one partner.  You take drugs by injection.  You are sexually active with a partner who has HIV.  Talk with your health care provider about whether you are at high risk of being infected with HIV. If you choose to begin PrEP, you should first be tested for HIV. You should then be tested every 3 months for as long as you are taking PrEP.  See your health care provider for regular screenings, exams, and tests for other STDs. Before having sex with a new partner, each of you should be screened for STDs and should talk about the results with each other. WHAT ARE THE BENEFITS OF SAFE SEX?   There is less chance of getting or giving an STD.  You can prevent unwanted or unintended pregnancies.  By discussing safe sex concerns with your partner, you may increase feelings of intimacy, comfort, trust, and honesty between the two of you. Document Released: 05/17/2004 Document Revised: 08/24/2013 Document Reviewed: 10/01/2011 [facility] Patient Information 2015 [place], [place]. This information is not intended to replace advice given to you by your health care provider. Make sure you discuss any questions you have with your health care provider.  Sexually Transmitted Disease A sexually transmitted disease (STD) is a disease or infection often passed to another person during sex. However, STDs can be passed through nonsexual ways. An STD can be passed through:  Spit (saliva).  Semen.  Blood.  Mucus from the vagina.  Pee (urine). HOW CAN I LESSEN MY CHANCES OF GETTING AN STD?  Use:  Latex condoms.  Water-soluble lubricants with condoms. Do not use petroleum jelly or oils.  Dental dams. These are small pieces of latex that are used as a barrier during oral sex.  Avoid having more than one sex partner.  Do not have sex with someone who has  other sex partners.  Do not have sex with anyone you do not know or who is at high risk for an STD.  Avoid risky sex that can break your skin.  Do not have sex if you have open sores on your mouth or skin.  Avoid drinking too much alcohol or taking illegal drugs. Alcohol and drugs can affect your good judgment.  Avoid oral and anal sex acts.  Get shots (vaccines) for HPV and hepatitis.  If you are at risk of being infected with HIV, it is advised that you take a certain medicine daily to prevent HIV infection. This is called pre-exposure prophylaxis (PrEP). You may be at risk if:  You are a man who has sex with other men (MSM).  You are attracted to the opposite sex (heterosexual) and are having sex with more than one partner.  You take drugs with a needle.  You have sex with someone who has HIV.  Talk with your doctor about if you are at high risk of being infected with HIV. If you begin to take PrEP, get tested for HIV first. Get tested every 3 months for as long as you are taking PrEP. WHAT SHOULD I DO IF I THINK I HAVE AN STD?  See your doctor.  Tell your sex partner(s) that you have an STD. They should be tested and treated.  Do not have sex until your doctor says it is okay. WHEN SHOULD I GET HELP? Get help right away if:  You have bad belly (abdominal) pain.  You are a man and have puffiness (swelling) or pain in your testicles.  You are a woman and have puffiness in your vagina. Document Released: 05/17/2004 Document Revised: 04/14/2013 Document Reviewed: 10/03/2012 [facility] Patient Information 2015 [place], [place]. This information is not intended to replace advice given to you by your health care provider. Make sure you discuss any questions you have with your health care provider.

## 2014-04-24 NOTE — ED Provider Notes (Signed)
CSN: [id number]     Arrival date & time 04/24/14  1709 History   First MD Initiated Contact with Patient 04/24/14 1806     Chief Complaint  Patient presents with  . Exposure to STD     (Consider location/radiation/quality/duration/timing/severity/associated sxs/prior Treatment) HPI  James Meadows is a 24 y.o. male who presents to the Emergency Department requesting evaluation for possible exposure to STD.  He states that he was notified last evening that his last sexual partner has tested positive for gonorrhea. He denies any symptoms currently including fever, abdominal pain, penile discharge, genital lesions or dysuria. He states his last sexual encounter was 1 week ago and he reports being seen here previously treated for an STD.    History reviewed. No pertinent past medical history. Past Surgical History  Procedure Laterality Date  . Finger surgery Right     pinky   History reviewed. No pertinent family history. History  Substance Use Topics  . Smoking status: Current Some Day Smoker -- 6 years    Types: Cigarettes  . Smokeless tobacco: Never Used  . Alcohol Use: Yes     Comment: Occ    Review of Systems  Constitutional: Negative for fever, chills, activity change and appetite change.  Respiratory: Negative for cough.   Cardiovascular: Negative for chest pain.  Gastrointestinal: Negative for nausea, vomiting, abdominal pain and diarrhea.  Genitourinary: Negative for dysuria, hematuria, decreased urine volume, discharge, penile swelling, scrotal swelling, difficulty urinating, genital sores, penile pain and testicular pain.  Musculoskeletal: Negative for back pain.  Skin: Negative for color change and rash.  Neurological: Negative for dizziness, weakness and numbness.  All other systems reviewed and are negative.     Allergies  Oxycodone; Ibuprofen; and Latex  Home Medications   Prior to Admission medications   Not on File   BP 137/74 mmHg  Pulse 77   Temp(Src) 98.9 F (37.2 C) (Oral)  Resp 16  Ht  (1.651 m)  Wt 138 lb (62.596 kg)  BMI 22.96 kg/m2  SpO2 100% Physical Exam  Constitutional: He is oriented to person, place, and time. He appears well-developed and well-nourished. No distress.  HENT:  Head: Normocephalic and atraumatic.  Neck: Normal range of motion. Neck supple.  Cardiovascular: Normal rate, regular rhythm, normal heart sounds and intact distal pulses.   No murmur heard. Pulmonary/Chest: Effort normal and breath sounds normal. No respiratory distress.  Abdominal: Hernia confirmed negative in the right inguinal area and confirmed negative in the left inguinal area.  Genitourinary: Testes normal. Cremasteric reflex is present. Right testis shows no mass, no swelling and no tenderness. Right testis is descended. Cremasteric reflex is not absent on the right side. Left testis shows no mass, no swelling and no tenderness. Left testis is descended. Cremasteric reflex is not absent on the left side. Circumcised. No penile erythema. No discharge found.  Musculoskeletal: Normal range of motion.  Lymphadenopathy:       Right: No inguinal adenopathy present.       Left: No inguinal adenopathy present.  Neurological: He is alert and oriented to person, place, and time. He exhibits normal muscle tone. Coordination normal.  Skin: Skin is warm and dry. No rash noted.    ED Course  Procedures (including critical care time) Labs Review Labs Reviewed  URINALYSIS, ROUTINE W REFLEX MICROSCOPIC - Abnormal; Notable for the following:    APPearance CLOUDY (*)    Hgb urine dipstick TRACE (*)    Leukocytes, UA SMALL (*)  All other components within normal limits  URINE MICROSCOPIC-ADD ON - Abnormal; Notable for the following:    Bacteria, UA FEW (*)    All other components within normal limits  GC/CHLAMYDIA PROBE AMP  URINE CULTURE  RPR  HIV ANTIBODY (ROUTINE TESTING)    Imaging Review No results found.   EKG  Interpretation None      MDM   Final diagnoses:  STD exposure    Patient is well-appearing. Vital signs are stable. Abdomen is soft and nontender on exam. Reported exposure to gonorrhea and patient was also seen here on 01/18/14 and had positive Chlamydia and gonorrhea cultures, so I will treat the patient today with IM Rocephin and Zithromax, cultures, RPR and HIV antibody testing are pending.  Patient counseled on safe sex practices, he agrees to follow up with health department or return here if needed. He appears stable for discharge.   Artie Takayama L. Trisha Mangle, PA-C 04/24/14 2225  Flint Melter, MD 04/24/14 [phone]

## 2014-04-26 LAB — URINE CULTURE
CULTURE: NO GROWTH
Colony Count: NO GROWTH

## 2014-04-26 LAB — RPR

## 2014-04-26 LAB — HIV ANTIBODY (ROUTINE TESTING W REFLEX): HIV: NONREACTIVE

## 2014-04-28 LAB — GC/CHLAMYDIA PROBE AMP
CT Probe RNA: NEGATIVE
GC PROBE AMP APTIMA: POSITIVE — AB

## 2014-04-29 ENCOUNTER — Telehealth (HOSPITAL_BASED_OUTPATIENT_CLINIC_OR_DEPARTMENT_OTHER): Payer: Self-pay | Admitting: Emergency Medicine

## 2014-04-29 NOTE — Telephone Encounter (Signed)
+  GC, was treated in the ED on 04/24/14

## 2014-04-30 ENCOUNTER — Telehealth (HOSPITAL_COMMUNITY): Payer: Self-pay

## 2014-04-30 NOTE — Telephone Encounter (Signed)
Pt called.  ID verified x 2.  Pt informed Gonorrhea (+), tx rcvd in ED appropriate, notify partner & abstain form sex x 2 weeks. 

## 2014-07-05 ENCOUNTER — Emergency Department (HOSPITAL_COMMUNITY)
Admission: EM | Admit: 2014-07-05 | Discharge: 2014-07-05 | Disposition: A | Discharge status: Home / Self Care | Payer: Medicaid Other | Attending: Emergency Medicine | Admitting: Emergency Medicine

## 2014-07-05 ENCOUNTER — Encounter (HOSPITAL_COMMUNITY): Payer: Self-pay

## 2014-07-05 DIAGNOSIS — Z91040 Latex allergy status: Secondary | ICD-10-CM | POA: Diagnosis not present

## 2014-07-05 DIAGNOSIS — Y9289 Other specified places as the place of occurrence of the external cause: Secondary | ICD-10-CM | POA: Diagnosis not present

## 2014-07-05 DIAGNOSIS — X58XXXA Exposure to other specified factors, initial encounter: Secondary | ICD-10-CM | POA: Diagnosis not present

## 2014-07-05 DIAGNOSIS — T424X2A Poisoning by benzodiazepines, intentional self-harm, initial encounter: Secondary | ICD-10-CM | POA: Diagnosis not present

## 2014-07-05 DIAGNOSIS — Y9389 Activity, other specified: Secondary | ICD-10-CM | POA: Insufficient documentation

## 2014-07-05 DIAGNOSIS — R45851 Suicidal ideations: Secondary | ICD-10-CM | POA: Insufficient documentation

## 2014-07-05 DIAGNOSIS — T50904A Poisoning by unspecified drugs, medicaments and biological substances, undetermined, initial encounter: Secondary | ICD-10-CM

## 2014-07-05 DIAGNOSIS — Y998 Other external cause status: Secondary | ICD-10-CM | POA: Insufficient documentation

## 2014-07-05 DIAGNOSIS — Z720 Tobacco use: Secondary | ICD-10-CM | POA: Insufficient documentation

## 2014-07-05 LAB — URINALYSIS, ROUTINE W REFLEX MICROSCOPIC
BILIRUBIN URINE: NEGATIVE
Glucose, UA: NEGATIVE mg/dL
Hgb urine dipstick: NEGATIVE
KETONES UR: NEGATIVE mg/dL
NITRITE: NEGATIVE
PROTEIN: NEGATIVE mg/dL
Specific Gravity, Urine: 1.015 (ref 1.005–1.030)
UROBILINOGEN UA: 0.2 mg/dL (ref 0.0–1.0)
pH: 6 (ref 5.0–8.0)

## 2014-07-05 LAB — CBC WITH DIFFERENTIAL/PLATELET
Basophils Absolute: 0 10*3/uL (ref 0.0–0.1)
Basophils Relative: 0 % (ref 0–1)
Eosinophils Absolute: 0.1 10*3/uL (ref 0.0–0.7)
Eosinophils Relative: 2 % (ref 0–5)
HEMATOCRIT: 44.7 % (ref 39.0–52.0)
HEMOGLOBIN: 15.3 g/dL (ref 13.0–17.0)
LYMPHS ABS: 1.8 10*3/uL (ref 0.7–4.0)
Lymphocytes Relative: 25 % (ref 12–46)
MCH: 30 pg (ref 26.0–34.0)
MCHC: 34.2 g/dL (ref 30.0–36.0)
MCV: 87.6 fL (ref 78.0–100.0)
MONOS PCT: 9 % (ref 3–12)
Monocytes Absolute: 0.7 10*3/uL (ref 0.1–1.0)
NEUTROS ABS: 4.7 10*3/uL (ref 1.7–7.7)
NEUTROS PCT: 64 % (ref 43–77)
Platelets: 214 10*3/uL (ref 150–400)
RBC: 5.1 MIL/uL (ref 4.22–5.81)
RDW: 12.2 % (ref 11.5–15.5)
WBC: 7.3 10*3/uL (ref 4.0–10.5)

## 2014-07-05 LAB — COMPREHENSIVE METABOLIC PANEL
ALK PHOS: 67 U/L (ref 39–117)
ALT: 15 U/L (ref 0–53)
ANION GAP: 6 (ref 5–15)
AST: 20 U/L (ref 0–37)
Albumin: 4.3 g/dL (ref 3.5–5.2)
BILIRUBIN TOTAL: 0.6 mg/dL (ref 0.3–1.2)
BUN: 13 mg/dL (ref 6–23)
CO2: 29 mmol/L (ref 19–32)
Calcium: 9.3 mg/dL (ref 8.4–10.5)
Chloride: 103 mmol/L (ref 96–112)
Creatinine, Ser: 0.9 mg/dL (ref 0.50–1.35)
GFR calc Af Amer: >90 mL/min (ref >90)
GFR calc non Af Amer: >90 mL/min (ref >90)
GLUCOSE: 87 mg/dL (ref 70–99)
Potassium: 3.8 mmol/L (ref 3.5–5.1)
Sodium: 138 mmol/L (ref 135–145)
TOTAL PROTEIN: 7.5 g/dL (ref 6.0–8.3)

## 2014-07-05 LAB — URINE MICROSCOPIC-ADD ON

## 2014-07-05 LAB — RAPID URINE DRUG SCREEN, HOSP PERFORMED
Amphetamines: NOT DETECTED
Barbiturates: NOT DETECTED
Benzodiazepines: POSITIVE — AB
COCAINE: NOT DETECTED
Opiates: NOT DETECTED
Tetrahydrocannabinol: POSITIVE — AB

## 2014-07-05 LAB — SALICYLATE LEVEL: Salicylate Lvl: <4 mg/dL (ref 2.8–20.0)

## 2014-07-05 LAB — ETHANOL

## 2014-07-05 LAB — ACETAMINOPHEN LEVEL: Acetaminophen (Tylenol), Serum: <10 ug/mL — ABNORMAL LOW (ref 10–30)

## 2014-07-05 NOTE — ED Notes (Signed)
Pt here for evaluation of possible overdose of unknown drugs. Mom is here now and states her son did not spend the night at home. States she got a call this morning that he was unresponsive.

## 2014-07-05 NOTE — ED Notes (Signed)
[facility] guidelines explained to parents and pt. Copy given to mom

## 2014-07-05 NOTE — Discharge Instructions (Signed)
Follow-up with [facility] tomorrow as arranged.   Overdose A drug overdose occurs when a chemical substance (drug or medication) is used in amounts large enough to overcome a person. This may result in severe illness or death. This is a type of poisoning. Accidental overdoses of medications or other substances come from a variety of reasons. When this happens accidentally, it is often because the person taking the substance does not know enough about what they have taken. Drugs which commonly cause overdose deaths are alcohol, psychotropic medications (medications which affect the mind), pain medications, illegal drugs (street drugs) such as cocaine and heroin, and multiple drugs taken at the same time. It may result from careless behavior (such as over-indulging at a party). Other causes of overdose may include multiple drug use, a lapse in memory, or drug use after a period of no drug use.  Sometimes overdosing occurs because a person cannot remember if they have taken their medication.  A common unintentional overdose in young children involves multi-vitamins containing iron. Iron is a part of the hemoglobin molecule in blood. It is used to transport oxygen to living cells. When taken in small amounts, iron allows the body to restock hemoglobin. In large amounts, it causes problems in the body. If this overdose is not treated, it can lead to death. Never take medicines that show signs of tampering or do not seem quite right. Never take medicines in the dark or in poor lighting. Read the label and check each dose of medicine before you take it. When adults are poisoned, it happens most often through carelessness or lack of information. Taking medicines in the dark or taking medicine prescribed for someone else to treat the same type of problem is a dangerous practice. SYMPTOMS  Symptoms of overdose depend on the medication and amount taken. They can vary from over-activity with stimulant over-dosage, to  sleepiness from depressants such as alcohol, narcotics and tranquilizers. Confusion, dizziness, nausea and vomiting may be present. If problems are severe enough coma and death may result. DIAGNOSIS  Diagnosis and management are generally straightforward if the drug is known. Otherwise it is more difficult. At times, certain symptoms and signs exhibited by the patient, or blood tests, can reveal the drug in question.  TREATMENT  In an emergency department, most patients can be treated with supportive measures. Antidotes may be available if there has been an overdose of opioids or benzodiazepines. A rapid improvement will often occur if this is the cause of overdose. At home or away from medical care:  There may be no immediate problems or warning signs in children.  Not everything works well in all cases of poisoning.  Take immediate action. Poisons may act quickly.  If you think someone has swallowed medicine or a household product, and the person is unconscious, having seizures (convulsions), or is not breathing, immediately call for an ambulance. IF a person is conscious and appears to be doing OK but has swallowed a poison:  Do not wait to see what effect the poison will have. Immediately call a poison control center (listed in the white pages of your telephone book under "Poison Control" or inside the front cover with other emergency numbers). Some poison control centers have TTY capability for the deaf. Check with your local center if you or someone in your family requires this service.  Keep the container so you can read the label on the product for ingredients.  Describe what, when, and how much was taken and the  age and condition of the person poisoned. Inform them if the person is vomiting, choking, drowsy, shows a change in color or temperature of skin, is conscious or unconscious, or is convulsing.  Do not cause vomiting unless instructed by medical personnel. Do not induce vomiting  or force liquids into a person who is convulsing, unconscious, or very drowsy. Stay calm and in control.   Activated charcoal also is sometimes used in certain types of poisoning and you may wish to add a supply to your emergency medicines. It is available without a prescription. Call a poison control center before using this medication. PREVENTION  Thousands of children die every year from unintentional poisoning. This may be from household chemicals, poisoning from carbon monoxide in a car, taking their parent's medications, or simply taking a few iron pills or vitamins with iron. Poisoning comes from unexpected sources.  Store medicines out of the sight and reach of children, preferably in a locked cabinet. Do not keep medications in a food cabinet. Always store your medicines in a secure place. Get rid of expired medications.  If you have children living with you or have them as occasional guests, you should have child-resistant caps on your medicine containers. Keep everything out of reach. Child proof your home.  If you are called to the telephone or to answer the door while you are taking a medicine, take the container with you or put the medicine out of the reach of small children.  Do not take your medication in front of children. Do not tell your child how good a medication is and how good it is for them. They may get the idea it is more of a treat.  If you are an adult and have accidentally taken an overdose, you need to consider how this happened and what can be done to prevent it from happening again. If this was from a street drug or alcohol, determine if there is a problem that needs addressing. If you are not sure a problems exists, it is easy to talk to a professional and ask them if they think you have a problem. It is better to handle this problem in this way before it happens again and has a much worse consequence. Document Released: 06/23/2004 Document Revised: 07/02/2011  Document Reviewed: 11/29/2008 [facility] Patient Information 2015 [place], [place]. This information is not intended to replace advice given to you by your health care provider. Make sure you discuss any questions you have with your health care provider.   Emergency Department Resource Guide 1) Find a Doctor and Pay Out of Pocket Although you won't have to find out who is covered by your insurance plan, it is a good idea to ask around and get recommendations. You will then need to call the office and see if the doctor you have chosen will accept you as a new patient and what types of options they offer for patients who are self-pay. Some doctors offer discounts or will set up payment plans for their patients who do not have insurance, but you will need to ask so you aren't surprised when you get to your appointment.  2) Contact Your Local Health Department Not all health departments have doctors that can see patients for sick visits, but many do, so it is worth a call to see if yours does. If you don't know where your local health department is, you can check in your phone book. The CDC also has a tool to help you locate  your state's health department, and many state websites also have listings of all of their local health departments.  3) Find a Walk-in Clinic If your illness is not likely to be very severe or complicated, you may want to try a walk in clinic. These are popping up all over the country in pharmacies, drugstores, and shopping centers. They're usually staffed by nurse practitioners or physician assistants that have been trained to treat common illnesses and complaints. They're usually fairly quick and inexpensive. However, if you have serious medical issues or chronic medical problems, these are probably not your best option.  No Primary Care Doctor: - Call Health Connect at  [phone] - they can help you locate a primary care doctor that  accepts your insurance, provides certain services,  etc. - Physician Referral Service- [phone]  Chronic Pain Problems: Organization         Address  Phone   Notes  [facility]  [phone] Patients need to be referred by their primary care doctor.   Medication Assistance: Organization         Address  Phone   Notes  [facility] Medication [facility] [address] [place], [place] [address] [phone] --Must be a resident of [facility] [place] -- Must have NO insurance coverage whatsoever (no Medicaid/ Medicare, etc.) -- The pt. MUST have a primary care doctor that directs their care regularly and follows them in the community   MedAssist  [phone]   [employer]  [phone]    Agencies that provide inexpensive medical care: Organization         Address  Phone   Notes  [facility]  [phone]   [facility]    [phone]   [facility] [address] [place], [place] [address] [phone]   [facility] [address], [place] [phone]   Planned Parenthood    [phone]   [facility]    [phone]   Community Health and [facility]  [address], [place] Phone:  [phone], Fax:  [phone] Hours of Operation:  9 am - 6 pm, M-F.  Also accepts Medicaid/Medicare and self-pay.  [facility]  [address], [place] Phone: [phone], Fax: [phone]. Hours of Operation:  8:30 am - 5:30 pm, M-F.  Also accepts Medicaid and self-pay.  [facility] [place] [address], [place] Phone: [phone]   [facility] [address], [place], [place] [phone], Ext. [phone] Mondays & Thursdays: 7-9 AM.  First 15 patients are seen on a first come, first serve basis.    Medicaid-accepting [facility] Providers:  Organization         Address  Phone   Notes  [facility] [address], [place] [phone] Also accepts self-pay patients.  [facility] [address] [place], [place]  [phone]   [facility] [address], [place] [phone]   [facility] [address] [place] [address], [place] [phone]   Renaye RakersVeita Bland [address], [place]   [phone] Only accepts [place] Access [place] patients after they have their name applied to their card.   Self-Pay (no insurance) in [facility] [place]:  Organization  Address  Phone   Notes  Sickle Cell Patients, [facility] Internal Medicine [place] [phone]   [facility] [place] [phone]   [facility] [place]  [address], [place] [phone]   [facility]/Dr. Osei-Bonsu  [address] [place] or [address], [place] [phone] Phone number for both [place] and [place] locations is the same.  Urgent Medical and [facility] [address] [place] [phone]   [facility] [address] [place] or [address] [phone]   [facility] [address] [place] [phone], phone; [phone], fax Sees patients 1st and 3rd Saturday of every month.  Must not qualify for public or private insurance (i.e. Medicaid, Medicare, [place] Health Choice, Veterans' Benefits)  Household income should be no more than 200% of the poverty level The clinic cannot treat you if you are pregnant or think you are pregnant  Sexually transmitted diseases are not treated at the clinic.    Dental Care: Organization         Address  Phone  Notes  [facility] of [facility] [place] [phone] Accepts children up to  age 47 who are enrolled in [place] or [place]; pregnant women with a Medicaid card; and children who have applied for Medicaid or [place] Health Choice, but were declined, whose parents can pay a reduced fee at time of service.  [facility] Department of [facility]  [address], [place] [phone] Accepts children up to age 59 who are enrolled in [place] or [place]; pregnant women with a Medicaid card; and children who have applied for Medicaid or [place] Health Choice, but were declined, whose parents can pay a reduced fee at time of service.  [facility] Access PROGRAM  [place] [phone] Patients are seen by appointment only. Walk-ins are not accepted. [place] will see patients 42 years of age and older. Monday - Tuesday (8am-5pm) Most Wednesdays (8:30-5pm) $30 per visit, cash only  [facility] Adult Dental Access PROGRAM  [address], [facility] [phone] Patients are seen by appointment only. Walk-ins are not accepted. [place] will see patients 77 years of age and older. One Wednesday Evening (Monthly: Volunteer Based).  $30 per visit, cash only  [place]  [phone] for adults; Children under age 57, call Graduate Pediatric Dentistry at [phone]. Children aged 78-14, please call [phone] to request a pediatric application.  Dental services are provided in all areas of dental care including fillings, crowns and bridges, complete and partial dentures, implants, gum treatment, root canals, and extractions. Preventive care is also provided. Treatment is provided to both adults and children. Patients are selected via a lottery and there is often a waiting list.   [facility] [address], [place]  [phone] [email or web address]   [facility] [address] [place], [place] [phone], Ext. [phone] Second and Fourth Thursday of  each month, opens at 6:30 AM; Clinic ends at 9 AM.  Patients are seen on a first-come first-served basis, and a limited number are seen during each clinic.   [facility]  [address] [place], [place]  [place], [place] [phone]   Eligibility Requirements You must have lived in [place], [place], or [place] counties for at least the last three months.   You cannot be eligible for state or federal sponsored [employer], including [employer], [place], or [employer].   You generally cannot be eligible for healthcare insurance through your employer.    How to apply: Eligibility screenings are held every Tuesday and Wednesday afternoon from 1:00 pm until 4:00 pm. You do not need an appointment for the interview!  [facility] [address], [place], [place]   [place]  [place] Department  [place]  [phone]    Behavioral Health Resources in the Community: Intensive Outpatient Programs Organization         Address  Phone  Notes  [place]. [address], [place], [place] [phone]   [facility] Outpatient [address], [place], [place]   ADS: Alcohol & Drug Svcs [address] [place], [place]   [place] [address]  [place], [place] or [phone]   Substance Abuse Resources Organization         Address  Phone  Notes  Alcohol and Drug Services  [phone]   [place]  [phone]   [facility] [place]   Chinita Pester  [phone]   Residential & Outpatient Substance Abuse Program  [phone]   Psychological Services Organization         Address  Phone  Notes  [facility] [place]  [place]  [phone]   [place] [address],  [place] or [phone]    Mobile Crisis Teams Organization         Address  Phone  Notes  [facility], Mobile Crisis Care Unit  [phone]   [facility]  [address] [place], [place]   [place] Levels [address] [place] [phone]    Self-Help/Support Groups Organization         Address  Phone             Notes  [place]. of [place] - variety of support groups  [place] Call for more information  Narcotics Anonymous (NA), [facility]aring [facility] [address], [employer] [place]  2 meetings at this location   Special educational needs teacher         Address  Phone  Notes  ASAP Residential Treatment [place],    [place]  [phone]   [facility]  [address], [place] 419622, [place], [place]   [place], [place] [phone] Admissions: 8am-3pm M-F  Incentives Substance [place] [address]    [place], [place] [phone]   [facility] [address] Jadene Pierini [place], [place]   [facility] [address]  [place], [place]   [facility] - Intensive Outpatient [address], [place], [place]   [facility] ([place].,  [place], [place] or [phone]   [facility] (RTS) [address], [place], [place] Accepts Medicaid  [facility]ship [place] [address]  [place] [phone] Substance Abuse/Addiction Treatment   [facility] Resources Organization  Address  Phone  Notes  [facility]  [phone]   Angie Fava, PhD [address] Ervin Knack [place], [place]   [phone] or [phone]   [facility] Behavioral   [address] [place], [place] [phone]     [facility] [address], [place], [place] [phone] Insurance/Medicaid/sponsorship through [facility] and Families [address]                                    [place], [place] [phone] Therapy/tele-psych/case  [facility] [address], [place] [phone]    Dr. Lolly Mustache  [phone]   [facility]  [employer] Way [facility] Dept. 1) [address], [place] 2) [address], [place] 3)  [address] [place] [address], [place] [phone]  [phone]   [facility] Child Abuse Hotline 731-687-9850 or [phone] (After Hours)

## 2014-07-05 NOTE — BH Assessment (Signed)
Writer informed that the patient is too drowsy to be assessed.  Writer was informed that the consult will be taken out and it will be place in when the patient is able to participate in the assessment.

## 2014-07-05 NOTE — ED Notes (Signed)
Advised by secretary that patient would not wake up for telepsych. Patient arousable, explained that patient needed to wake up to talk to psychiatrist. Patient verbalized understanding.

## 2014-07-05 NOTE — ED Notes (Signed)
Brandi from TTS called requesting mother or girlfriend contact number from patient. Upon requesting information from patient, patient stated "man, ya'll are gonna try to admit me." Patient gave mothers contact number as [phone]. Patient states "when you talk to my mom, tell her don't be trying to admit me." Gave contact information to [place].

## 2014-07-05 NOTE — ED Notes (Signed)
Patient alert and oriented at this time. Patient ambulated with no assistance or difficulty to bathroom. Gait steady. Patient removed from cardiac monitor. Room stripped of cords and wires per tech.

## 2014-07-05 NOTE — ED Notes (Signed)
Patient given meal tray. Patient lying in bed sleeping at this time. 

## 2014-07-05 NOTE — ED Provider Notes (Signed)
Care assumed from Dr. Wilkie AyeHorton at shift change. Patient was evaluated by TTS to feels as though he is appropriate for discharge and follow-up with [facility] in the morning. An appointment has been set up and the patient will sign a no harm contract in the meantime.  Geoffery Lyonsouglas Leanore Biggers, MD 07/05/14 [phone]

## 2014-07-05 NOTE — ED Notes (Signed)
Brandi from TTS called and stated patient would be discharged for outpatient treatment with a "no-harm contract." Advised by TTS that patient's follow up appointment would be tomorrow July 06, 2014 from 8:30-10:30. Obtained signature for "no-harm contract" and advised patient of follow-up appointment. Patient verbalized understanding.

## 2014-07-05 NOTE — ED Notes (Signed)
Patient reported to Dr Wilkie AyeHorton that he took something called "school bus" a "street pill" in which patient states has 30mg  of xanax in each and he took 8.

## 2014-07-05 NOTE — BH Assessment (Signed)
Writer informed of the consult.  The nurse is moving the machine in the room.

## 2014-07-05 NOTE — ED Notes (Signed)
Patient receiving telepsych at this time. 
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
## 2014-08-22 ENCOUNTER — Encounter (HOSPITAL_COMMUNITY): Payer: Self-pay | Admitting: Emergency Medicine

## 2014-08-22 ENCOUNTER — Emergency Department (HOSPITAL_COMMUNITY)
Admission: EM | Admit: 2014-08-22 | Discharge: 2014-08-22 | Disposition: A | Discharge status: Home / Self Care | Payer: Medicaid Other | Attending: Emergency Medicine | Admitting: Emergency Medicine

## 2014-08-22 DIAGNOSIS — Z91040 Latex allergy status: Secondary | ICD-10-CM | POA: Insufficient documentation

## 2014-08-22 DIAGNOSIS — Z139 Encounter for screening, unspecified: Secondary | ICD-10-CM

## 2014-08-22 DIAGNOSIS — Z720 Tobacco use: Secondary | ICD-10-CM | POA: Diagnosis not present

## 2014-08-22 DIAGNOSIS — Z008 Encounter for other general examination: Secondary | ICD-10-CM | POA: Diagnosis present

## 2014-08-22 DIAGNOSIS — Z0000 Encounter for general adult medical examination without abnormal findings: Secondary | ICD-10-CM | POA: Diagnosis not present

## 2014-08-22 NOTE — Discharge Instructions (Signed)
Your hand and wrist exam is normal today and I would not impose any restrictions on your activities with your hand at this time.

## 2014-08-22 NOTE — ED Provider Notes (Signed)
CSN: [id number]     Arrival date & time 08/22/14  1628 History  This chart was scribed for non-physician practitioner Burgess AmorJulie Doxie Augenstein, PA-C working with Geoffery Lyonsouglas Delo, MD by Littie Deedsichard Sun, ED Scribe. This patient was seen in room APFT24/APFT24 and the patient's care was started at 4:54 PM.      Chief Complaint  Patient presents with  . Medical Clearance   The history is provided by the patient. No language interpreter was used.   HPI Comments: James Meadows is a 24 y.o. male who presents to the Emergency Department for medical clearance. Patient had an injury to the pinky of the left hand in the past while boxing in the past. He states he needs to be medically cleared to box on 09/03/14, and wants XR imaging. He was never medically evaluated after his prior injury which he describes as bruising at his 5th knuckle on his left hand several months ago after a Immunologistboxing match. He did not think he fractured anything. He had applied ice and recovered after 1 week. He did not take any medications for the pain. Patient states his left hand feels normal right now, without any problems. He denies weakness and numbness. He is left-hand dominant.  No PCP per patient; he also does not have an orthopedic specialist  Patient boxes [place]  History reviewed. No pertinent past medical history. Past Surgical History  Procedure Laterality Date  . Finger surgery Right     pinky   History reviewed. No pertinent family history. History  Substance Use Topics  . Smoking status: Current Some Day Smoker -- 0.02 packs/day for 6 years    Types: Cigarettes  . Smokeless tobacco: Never Used  . Alcohol Use: Yes     Comment: Occ    Review of Systems  Constitutional: Negative for fever.  HENT: Negative for congestion and sore throat.   Eyes: Negative.   Respiratory: Negative for chest tightness and shortness of breath.   Cardiovascular: Negative for chest pain.   Gastrointestinal: Negative for nausea and abdominal pain.  Genitourinary: Negative.   Musculoskeletal: Negative for joint swelling, arthralgias and neck pain.  Skin: Negative.  Negative for rash and wound.  Neurological: Negative for dizziness, weakness, light-headedness, numbness and headaches.  Psychiatric/Behavioral: Negative.       Allergies  Oxycodone; Ibuprofen; and Latex  Home Medications   Prior to Admission medications   Not on File   BP 140/68 mmHg  Pulse 72  Temp(Src) 98.7 F (37.1 C) (Oral)  Resp 18  Ht 5\' 6"  (1.676 m)  Wt 147 lb (66.679 kg)  BMI 23.74 kg/m2  SpO2 100% Physical Exam  Constitutional: He appears well-developed and well-nourished.  HENT:  Head: Normocephalic and atraumatic.  Eyes: Conjunctivae are normal.  Neck: Normal range of motion.  Cardiovascular: Normal rate.   Pulses equal bilaterally  Pulmonary/Chest: Effort normal.  Musculoskeletal: Normal range of motion. He exhibits no tenderness.  Patient has full ROM of left hand and wrist. He has no tenderness and no deformity of joints. Full strength in fingers with normal sensation. Radial pulses intact. Essentially normal exam.  Neurological: He is alert. He has normal strength. He displays normal reflexes. No sensory deficit.  Skin: Skin is warm and dry.  Psychiatric: He has a normal mood and affect.  Nursing note and vitals reviewed.   ED Course  Procedures  DIAGNOSTIC STUDIES: Oxygen Saturation is 100% on room air, normal by my interpretation.  COORDINATION OF CARE: 5:00 PM-Discussed treatment plan which includes note for medical clearance with patient/guardian at bedside and patient/guardian agreed to plan. Discussed that no indication of XR imaging was needed, and patient was in agreement.   Labs Review Labs Reviewed - No data to display  Imaging Review No results found.   EKG Interpretation None      MDM   Final diagnoses:  Encounter for medical screening  examination    No indication for imaging of this patients hand.  He was encouraged to obtain a pcp as this is inappropriate use of the emergency department.  He was given referrals for obtaining a pcp.  He has a normal left hand exam and note given stating he has no limitations at this time using the hand.  I personally performed the services described in this documentation, which was scribed in my presence. The recorded information has been reviewed and is accurate.   Burgess Amor, PA-C 08/24/14 [id number]  Geoffery Lyons, MD 08/25/14 [phone]

## 2014-08-22 NOTE — ED Notes (Signed)
Patient needs to be medically cleared to box tonight. Per patient injured left hand in past and must be cleared by a doctor before being allowed to fight.

## 2015-04-29 ENCOUNTER — Encounter (HOSPITAL_COMMUNITY): Payer: Self-pay | Admitting: *Deleted

## 2015-04-29 ENCOUNTER — Emergency Department (HOSPITAL_COMMUNITY)
Admission: EM | Admit: 2015-04-29 | Discharge: 2015-04-29 | Disposition: A | Discharge status: Other Acute Inpatient Hospital | Payer: Medicaid Other | Attending: Emergency Medicine | Admitting: Emergency Medicine

## 2015-04-29 ENCOUNTER — Encounter (HOSPITAL_COMMUNITY): Payer: Self-pay | Admitting: Emergency Medicine

## 2015-04-29 ENCOUNTER — Inpatient Hospital Stay (HOSPITAL_COMMUNITY)
Admission: AD | Admit: 2015-04-29 | Discharge: 2015-05-02 | DRG: 885 | Disposition: A | Discharge status: Home / Self Care | Payer: Medicaid Other | Source: Intra-hospital | Attending: Psychiatry | Admitting: Psychiatry

## 2015-04-29 DIAGNOSIS — F17200 Nicotine dependence, unspecified, uncomplicated: Secondary | ICD-10-CM | POA: Diagnosis present

## 2015-04-29 DIAGNOSIS — F4310 Post-traumatic stress disorder, unspecified: Secondary | ICD-10-CM | POA: Diagnosis not present

## 2015-04-29 DIAGNOSIS — G4700 Insomnia, unspecified: Secondary | ICD-10-CM | POA: Diagnosis present

## 2015-04-29 DIAGNOSIS — F1210 Cannabis abuse, uncomplicated: Secondary | ICD-10-CM | POA: Insufficient documentation

## 2015-04-29 DIAGNOSIS — T424X2A Poisoning by benzodiazepines, intentional self-harm, initial encounter: Secondary | ICD-10-CM | POA: Diagnosis not present

## 2015-04-29 DIAGNOSIS — F419 Anxiety disorder, unspecified: Secondary | ICD-10-CM | POA: Diagnosis present

## 2015-04-29 DIAGNOSIS — R45851 Suicidal ideations: Secondary | ICD-10-CM | POA: Diagnosis present

## 2015-04-29 DIAGNOSIS — Z91040 Latex allergy status: Secondary | ICD-10-CM | POA: Insufficient documentation

## 2015-04-29 DIAGNOSIS — F329 Major depressive disorder, single episode, unspecified: Secondary | ICD-10-CM | POA: Diagnosis not present

## 2015-04-29 DIAGNOSIS — T50901A Poisoning by unspecified drugs, medicaments and biological substances, accidental (unintentional), initial encounter: Secondary | ICD-10-CM | POA: Insufficient documentation

## 2015-04-29 DIAGNOSIS — Z046 Encounter for general psychiatric examination, requested by authority: Secondary | ICD-10-CM | POA: Diagnosis present

## 2015-04-29 DIAGNOSIS — F332 Major depressive disorder, recurrent severe without psychotic features: Principal | ICD-10-CM | POA: Diagnosis present

## 2015-04-29 DIAGNOSIS — F1120 Opioid dependence, uncomplicated: Secondary | ICD-10-CM | POA: Diagnosis present

## 2015-04-29 DIAGNOSIS — F11220 Opioid dependence with intoxication, uncomplicated: Secondary | ICD-10-CM | POA: Diagnosis not present

## 2015-04-29 DIAGNOSIS — T1491XA Suicide attempt, initial encounter: Secondary | ICD-10-CM

## 2015-04-29 LAB — COMPREHENSIVE METABOLIC PANEL
ALBUMIN: 4.6 g/dL (ref 3.5–5.0)
ALT: 14 U/L — AB (ref 17–63)
AST: 16 U/L (ref 15–41)
Alkaline Phosphatase: 63 U/L (ref 38–126)
Anion gap: 7 (ref 5–15)
BILIRUBIN TOTAL: 1.1 mg/dL (ref 0.3–1.2)
BUN: 16 mg/dL (ref 6–20)
CALCIUM: 9.5 mg/dL (ref 8.9–10.3)
CO2: 28 mmol/L (ref 22–32)
CREATININE: 0.88 mg/dL (ref 0.61–1.24)
Chloride: 103 mmol/L (ref 101–111)
GFR calc Af Amer: >60 mL/min (ref >60)
GFR calc non Af Amer: >60 mL/min (ref >60)
GLUCOSE: 88 mg/dL (ref 65–99)
Potassium: 4 mmol/L (ref 3.5–5.1)
Sodium: 138 mmol/L (ref 135–145)
Total Protein: 7.7 g/dL (ref 6.5–8.1)

## 2015-04-29 LAB — SALICYLATE LEVEL

## 2015-04-29 LAB — RAPID URINE DRUG SCREEN, HOSP PERFORMED
Amphetamines: NOT DETECTED
BARBITURATES: NOT DETECTED
Benzodiazepines: POSITIVE — AB
Cocaine: NOT DETECTED
Opiates: NOT DETECTED
TETRAHYDROCANNABINOL: POSITIVE — AB

## 2015-04-29 LAB — CBC
HEMATOCRIT: 46 % (ref 39.0–52.0)
Hemoglobin: 15.9 g/dL (ref 13.0–17.0)
MCH: 29.8 pg (ref 26.0–34.0)
MCHC: 34.6 g/dL (ref 30.0–36.0)
MCV: 86.3 fL (ref 78.0–100.0)
Platelets: 226 10*3/uL (ref 150–400)
RBC: 5.33 MIL/uL (ref 4.22–5.81)
RDW: 12.5 % (ref 11.5–15.5)
WBC: 10.5 10*3/uL (ref 4.0–10.5)

## 2015-04-29 LAB — ETHANOL: Alcohol, Ethyl (B): <5 mg/dL (ref <5)

## 2015-04-29 LAB — ACETAMINOPHEN LEVEL: Acetaminophen (Tylenol), Serum: <10 ug/mL — ABNORMAL LOW (ref 10–30)

## 2015-04-29 MED ORDER — ALUM & MAG HYDROXIDE-SIMETH 200-200-20 MG/5ML PO SUSP: 30.0000 mL | ORAL | Status: DC | PRN

## 2015-04-29 MED ORDER — MAGNESIUM HYDROXIDE 400 MG/5ML PO SUSP: 30.0000 mL | Freq: Every day | ORAL | Status: DC | PRN

## 2015-04-29 MED ORDER — PRAZOSIN HCL 2 MG PO CAPS
2.0000 mg | ORAL_CAPSULE | Freq: Every day | ORAL | Status: DC
  Administered 2015-04-29 – 2015-05-01 (×3): 2 mg via ORAL
  Filled 2015-04-29 (×3): qty 1
  Filled 2015-04-29: qty 7
  Filled 2015-04-29: qty 2
  Filled 2015-04-29 (×2): qty 1

## 2015-04-29 MED ORDER — ZOLPIDEM TARTRATE 5 MG PO TABS: 5.0000 mg | ORAL_TABLET | Freq: Every evening | ORAL | Status: DC | PRN

## 2015-04-29 MED ORDER — ACETAMINOPHEN 325 MG PO TABS: 650.0000 mg | ORAL_TABLET | Freq: Four times a day (QID) | ORAL | Status: DC | PRN

## 2015-04-29 MED ORDER — ONDANSETRON HCL 4 MG PO TABS: 4.0000 mg | ORAL_TABLET | Freq: Three times a day (TID) | ORAL | Status: DC | PRN

## 2015-04-29 MED ORDER — ACETAMINOPHEN 325 MG PO TABS: 650.0000 mg | ORAL_TABLET | ORAL | Status: DC | PRN

## 2015-04-29 MED ORDER — TRAZODONE HCL 50 MG PO TABS
50.0000 mg | ORAL_TABLET | Freq: Every evening | ORAL | Status: DC | PRN
  Administered 2015-04-29 – 2015-05-01 (×3): 50 mg via ORAL
  Filled 2015-04-29 (×2): qty 1
  Filled 2015-04-29: qty 14
  Filled 2015-04-29: qty 1
  Filled 2015-04-29: qty 14
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
## 2015-04-29 NOTE — ED Notes (Signed)
[facility] Conrad NP states pt meets inpatient [facility]

## 2015-04-29 NOTE — ED Provider Notes (Signed)
CSN: [id number]     Arrival date & time 04/29/15  1305 History   First MD Initiated Contact with Patient 04/29/15 1312     Chief Complaint  Patient presents with  . Drug Overdose      HPI The patient reports they took 6 Xanax earlier today and attempted to kill himself He reports his been depressed over the past several years after his girlfriend committed suicide.  He was hospitalized one year ago for suicidal thoughts.  He reports marijuana abuse.  Denies other drug abuse.  Denies alcohol abuse.  Denies alcohol use today.  Denies coingestions today.  Does report occasional alcohol use but nothing daily.  Denies hallucinations.  He reports he is on no medications for mental health.  He does not have a psychiatrist who follows with.  No other complaints at this time.  Denies chest pain or shortness breath.  Denies abdominal pain.  No vomiting or diarrhea.   History reviewed. No pertinent past medical history. Past Surgical History  Procedure Laterality Date  . Finger surgery Right     pinky   History reviewed. No pertinent family history. Social History  Substance Use Topics  . Smoking status: Current Some Day Smoker -- 0.02 packs/day for 6 years  . Smokeless tobacco: Never Used  . Alcohol Use: Yes     Comment: Occ    Review of Systems  All other systems reviewed and are negative.     Allergies  Oxycodone; Ibuprofen; and Latex  Home Medications   Prior to Admission medications   Not on File   BP 126/83 mmHg  Pulse 88  Temp(Src) 98 F (36.7 C) (Oral)  Resp 14  Ht 5\' 7"  (1.702 m)  Wt 150 lb (68.04 kg)  BMI 23.49 kg/m2  SpO2 100% Physical Exam  Constitutional: He is oriented to person, place, and time. He appears well-developed and well-nourished.  HENT:  Head: Normocephalic and atraumatic.  Eyes: EOM are normal.  Neck: Normal range of motion.  Cardiovascular: Normal rate, regular rhythm, normal heart sounds and intact distal pulses.   Pulmonary/Chest: Effort  normal and breath sounds normal. No respiratory distress.  Abdominal: Soft. He exhibits no distension. There is no tenderness.  Musculoskeletal: Normal range of motion.  Neurological: He is alert and oriented to person, place, and time.  Skin: Skin is warm and dry.  Psychiatric:  Suicidal thoughts.  Flat affect.  Soft spoken  Nursing note and vitals reviewed.   ED Course  Procedures (including critical care time) Labs Review Labs Reviewed  CBC  COMPREHENSIVE METABOLIC PANEL  ETHANOL  URINE RAPID DRUG SCREEN, HOSP PERFORMED  SALICYLATE LEVEL  ACETAMINOPHEN LEVEL    Imaging Review No results found. I have personally reviewed and evaluated these images and lab results as part of my medical decision-making.  ECG interpretation   Date: 04/29/2015  Rate: 97  Rhythm: normal sinus rhythm  QRS Axis: normal  Intervals: normal  ST/T Wave abnormalities: normal  Conduction Disutrbances: none  Narrative Interpretation:   Old EKG Reviewed: No significant changes noted     MDM   Final diagnoses:  None     Pt is cleared medically from his minimal overdose. Will need TTS evaluation and placement for depression and SI and suicide attempt   Azalia BilisKevin Norwin Aleman, MD 04/29/15 1406

## 2015-04-29 NOTE — Progress Notes (Signed)
This Clinical research associatewriter spoke with Elmarie Shileyiffany, RN and she will set up machine for telepsych.  Maryelizabeth Rowanressa Yair Dusza, MSW, Clare CharonLCSW, LCAS [facility] Triage Specialist [phone]

## 2015-04-29 NOTE — Progress Notes (Signed)
This Clinical research associatewriter consulted with Renata CapriceConrad it is recommended to refer for inpatient hospitalization. Juanda ChanceKanisha, [facility] and he can transferred after 5pm.  Spoke with Gerri Sporeiffany, Charge RN and informed accepting is Dr. Jama Flavorsobos. Call report to [phone]   Maryelizabeth Rowanressa Yarielys Beed, MSW, Clare CharonLCSW, LCAS [facility] T[facility] Specialist [phone]

## 2015-04-29 NOTE — [facility] Group Notes (Signed)
Pt attended wrap up group.  Pt is a new admit.  He stated his day was not going good.  He also stated his depression lead him here.  His goal is to "get as much help as possible".  James RancherMarjette Camy Leder, MHT

## 2015-04-29 NOTE — ED Notes (Signed)
Pt states that he took Xanax x6 earlier today trying to kill himself because he is very depressed.  States that he has nobody since his girlfriend died 3 years ago.

## 2015-04-30 DIAGNOSIS — R45851 Suicidal ideations: Secondary | ICD-10-CM | POA: Insufficient documentation

## 2015-04-30 DIAGNOSIS — F11220 Opioid dependence with intoxication, uncomplicated: Secondary | ICD-10-CM

## 2015-04-30 DIAGNOSIS — T50901A Poisoning by unspecified drugs, medicaments and biological substances, accidental (unintentional), initial encounter: Secondary | ICD-10-CM | POA: Insufficient documentation

## 2015-04-30 DIAGNOSIS — F4310 Post-traumatic stress disorder, unspecified: Secondary | ICD-10-CM
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
## 2015-04-30 NOTE — Progress Notes (Signed)
Adult Psychoeducational Group Note  Date:  04/30/2015 Time:  10:52 PM  Group Topic/Focus:  Wrap-Up Group:   The focus of this group is to help patients review their daily goal of treatment and discuss progress on daily workbooks.  Participation Level:  Minimal  Participation Quality:  Appropriate  Affect:  Appropriate  Cognitive:  Appropriate  Insight: Appropriate and Good  Engagement in Group:  Engaged  Modes of Intervention:  Education  Additional Comments:  Pt overall had a great day. Pt is discharging Tuesday and wants to work on discharge planning.   Merlinda FrederickKeshia S Beckem Tomberlin 04/30/2015, 10:52 PM

## 2015-04-30 NOTE — [facility] Suicide Risk Assessment (Signed)
[facility]   Nursing information obtained from:  Patient Demographic factors:  Adolescent or young adult, Low socioeconomic status, Unemployed Current Mental Status:  Suicidal ideation indicated by patient Loss Factors:  Loss of significant relationship, Financial problems / change in socioeconomic status Historical Factors:  Anniversary of important loss Risk Reduction Factors:  Responsible for children under 25 years of age, Sense of responsibility to family Total Time spent with patient: 1 hour Principal Problem: Major depressive disorder, recurrent severe without psychotic features (HCC) Diagnosis:   Patient Active Problem List   Diagnosis Date Noted  . Major depressive disorder, recurrent severe without psychotic features (HCC) [F33.2] 04/29/2015  . PTSD (post-traumatic stress disorder) [F43.10] 04/29/2015  . Opioid dependence (HCC) [F11.20] 04/29/2015  . Marijuana abuse [F12.10] 08/22/2012  . ADHD (attention deficit hyperactivity disorder) [F90.9] 08/22/2012  . 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  World Science writerHealth Organization [facility](WHO). Score between 0-7:  no or low risk or alcohol related problems. Score between 8-15:  moderate risk of alcohol related problems. Score between 16-19:  high risk of alcohol related problems. Score 20 or above:  warrants further diagnostic evaluation for alcohol dependence and treatment.   CLINICAL FACTORS:   Severe Anxiety and/or Agitation Depression:   Anhedonia Comorbid alcohol abuse/dependence Hopelessness Impulsivity Insomnia Recent sense of peace/wellbeing Severe Alcohol/Substance Abuse/Dependencies More than one psychiatric diagnosis Unstable or Poor Therapeutic Relationship Previous Psychiatric Diagnoses  and Treatments   Musculoskeletal: Strength & Muscle Tone: within normal limits Gait & Station: normal Patient leans: N/A  Psychiatric Specialty Exam: Physical Exam  ROS denied nausea, vomiting, abdominal pain, sweating, shaking, tremors, chest pain and shortness of breath but endorses depression, anxiety, substance abuse and suicidal ideation and intentional overdose No Fever-chills, No Headache, No changes with Vision or hearing, reports vertigo No problems swallowing food or Liquids, No Chest pain, Cough or Shortness of Breath, No Abdominal pain, No Nausea or Vommitting, Bowel movements are regular, No Blood in stool or Urine, No dysuria, No new skin rashes or bruises, No new joints pains-aches,  No new weakness, tingling, numbness in any extremity, No recent weight gain or loss, No polyuria, polydypsia or polyphagia,   A full 10 point Review of Systems was done, except as stated above, all other Review of Systems were negative.  Blood pressure 113/67, pulse 126, temperature 98 F (36.7 C), temperature source Oral, resp. rate 16.There is no weight on file to calculate BMI.  General Appearance: Casual  Eye Contact::  Good  Speech:  Clear and Coherent  Volume:  Normal  Mood:  Anxious and Depressed  Affect:  Depressed  Thought Process:  Coherent and Goal Directed  Orientation:  Full (Time, Place, and Person)  Thought Content:  Rumination  Suicidal Thoughts:  Yes.  with intent/plan  Homicidal Thoughts:  No  Memory:  Immediate;   Good Recent;   Good  Judgement:  Good  Insight:  Fair  Psychomotor Activity:  Normal  Concentration:  Good  Recall:  Good  Fund of Knowledge:Good  Language: Good  Akathisia:  Negative  Handed:  Right  AIMS (if indicated):     Assets:  Communication Skills Desire for Improvement Financial Resources/Insurance Housing Leisure Time Physical Health Resilience Social Support Talents/Skills Transportation  Sleep:  Number of Hours: 6.5   Cognition: WNL  ADL's:  Intact     COGNITIVE FEATURES THAT CONTRIBUTE TO RISK:  Closed-mindedness, Loss of executive function and Polarized thinking    SUICIDE RISK:   Moderate:  Frequent suicidal ideation with limited intensity, and duration, some specificity in terms of plans, no associated intent, good self-control, limited dysphoria/symptomatology, some risk factors present, and identifiable protective factors, including available and accessible social support.  PLAN OF CARE: Admit for increased symptoms of depression, anxiety and polysubstance abuse with the suicidal ideation and intentional overdose. Patient needed crisis stabilization, safety monitoring and medication management for depression and anxiety  Medical Decision Making:  Review of Psycho-Social Stressors (1), Review or order clinical lab tests (1), Established Problem, Worsening (2), Review of Last Therapy Session (1), Review or order medicine tests (1), Review of Medication Regimen & Side Effects (2) and Review of New Medication or Change in Dosage (2)  I certify that inpatient services furnished can reasonably be expected to improve the patient's condition.   Addalynn Kumari,JANARDHAHA R. 04/30/2015, 5:33 PM
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
## 2015-04-30 NOTE — Progress Notes (Signed)
D) Pt requested and signed his 72 hour form today at 1517. States, "I just don't want to stay here. Don't think I need to be here anymore. Affect is flat and mood depressed. Tends to be withdrawn and to stay to himself much of the time. Has attended the groups but tends to listen rather than actively participate. Pt rates his depression at a 5, hopelessness at a 5 and his anxiety at a 3. Denies SI and HI. A)Given support, reassurance and praise. Provided with a 1:1, along with encouargment. R) Pt denies SI and HI.

## 2015-04-30 NOTE — Tx Team (Signed)
Initial Interdisciplinary Treatment Plan   PATIENT STRESSORS: Financial difficulties Substance abuse Traumatic event   PATIENT STRENGTHS: Average or above average intelligence Communication skills General fund of knowledge Motivation for treatment/growth Supportive family/friends   PROBLEM LIST: Problem List/Patient Goals Date to be addressed Date deferred Reason deferred Estimated date of resolution  "I overdosed on some pill" 04-29-15     "GF died 3 years ago, I gave up on life"  04-29-15     "get some help before I end up killing myself"  04-29-15     "scared of the dark, not being able to sleep, can't take this" 04-29-15                                    DISCHARGE CRITERIA:  Ability to meet basic life and health needs Improved stabilization in mood, thinking, and/or behavior Motivation to continue treatment in a less acute level of care Need for constant or close observation no longer present Reduction of life-threatening or endangering symptoms to within safe limits Verbal commitment to aftercare and medication compliance  PRELIMINARY DISCHARGE PLAN: Attend aftercare/continuing care group Outpatient therapy Participate in family therapy Return to previous living arrangement  PATIENT/FAMIILY INVOLVEMENT: This treatment plan has been presented to and reviewed with the patient, James Meadows, and/or family member.  The patient and family have been given the opportunity to ask questions and make suggestions.  James NeedyJohnson, James Meadows 04/30/2015, 2:42 AM

## 2015-04-30 NOTE — Progress Notes (Signed)
Patient ID: James Meadows, male   DOB: 07-01-1990, 25 y.o.   MRN: [id number]

## 2015-04-30 NOTE — [facility] Group Notes (Signed)
Adult Therapy Group Note  Date:  04/30/2015 Time:  1:45-2:45 PM  Group Topic/Focus:  Managing Feelings:   The focus of this group was to identify what feelings patients have difficulty handling and develop a plan to handle them in a healthier way upon discharge.  Focus was on anger outbursts and an Anger Evaluation tool was used to elicit thought and motivate toward change.  Participation Level:  Active  Participation Quality:  Attentive, Drowsy, Monopolizing, Resistant and Sharing  Affect:  Blunted and Irritable  Cognitive:  Oriented  Insight: Improving  Engagement in Group:  Distracting  Modes of Intervention:  Discussion and Motivational Interviewing  Additional Comments:  James Meadows was angry about the lunch that he had just returned from, and was focused on that and how he feels disrespected and mistreated by some staff particularly doctors.  He could return to topic at times, but not always.    Sarina SerGrossman-Orr, Flem Enderle Jo 04/30/2015, 3:46 PM

## 2015-04-30 NOTE — H&P (Signed)
Psychiatric Admission Assessment Adult  Patient Identification: James Meadows MRN:  [id number] Date of Evaluation:  04/30/2015 Chief Complaint:  MDD Principal Diagnosis: Major depressive disorder, recurrent severe without psychotic features ([place]) Diagnosis:   Patient Active Problem List   Diagnosis Date Noted  . Major depressive disorder, recurrent severe without psychotic features ([place]) [F33.2] 04/29/2015  . PTSD (post-traumatic stress disorder) [F43.10] 04/29/2015  . Opioid dependence ([place]) [F11.20] 04/29/2015  . Marijuana abuse [F12.10] 08/22/2012  . ADHD (attention deficit hyperactivity disorder) [F90.9] 08/22/2012  . Episodic mood disorder ([place]) [F39] 08/22/2012   History of Present Illness:  James Meadows is an 25 y.o. male. Patient was brought into the ED by EMS called by family after he text them apologizing for pending death.  He states today that he was remorseful for what happned.  He stated that he did not mean what he said.  He states that he just thought that no one cared for him and that he gets these moods close to the death anniversary of a girlfriend.  He had an inpatient admission in 2014 for family stressors.  Per chart recors, he had used substances like benzo's and THC in the past.  .    Associated Signs/Symptoms: Depression Symptoms:  anxiety, (Hypo) Manic Symptoms:  Labiality of Mood, Anxiety Symptoms:  Excessive Worry, Psychotic Symptoms:  NA PTSD Symptoms: Had a traumatic exposure:  death of a girlfriend Total Time spent with patient: 30 minutes  Past Psychiatric History:  See above  Risk to Self: Is patient at risk for suicide?: Yes Risk to Others:   Prior Inpatient Therapy:   Prior Outpatient Therapy:    Alcohol Screening: Patient refused Alcohol Screening Tool: Yes 1. How often do you have a drink containing alcohol?: Never 9. Have you or someone else been injured as a result of your drinking?: No 10. Has a relative or friend or a doctor or another  health worker been concerned about your drinking or suggested you cut down?: No Alcohol Use Disorder Identification Test Final Score (AUDIT): 0 Substance Abuse History in the last 12 months:  Yes.   Consequences of Substance Abuse: NA Previous Psychotropic Medications: Yes  Psychological Evaluations: Yes  Past Medical History: History reviewed. No pertinent past medical history.  Past Surgical History  Procedure Laterality Date  . Finger surgery Right     pinky   Family History: History reviewed. No pertinent family history. Family Psychiatric  History: Denies Social History:  History  Alcohol Use  . Yes    Comment: Occ     History  Drug Use  . Yes  . Special: Marijuana    Social History   Social History  . Marital Status: Single    Spouse Name: N/A  . Number of Children: N/A  . Years of Education: N/A   Social History Main Topics  . Smoking status: Current Some Day Smoker -- 0.02 packs/day for 6 years  . Smokeless tobacco: Never Used  . Alcohol Use: Yes     Comment: Occ  . Drug Use: Yes    Special: Marijuana  . Sexual Activity: Not Asked   Other Topics Concern  . None   Social History Narrative   Additional Social History:  Allergies:   Allergies  Allergen Reactions  . Oxycodone Anaphylaxis  . Ibuprofen Other (See Comments)    "skin burning"  . Latex Itching   Lab Results:  Results for orders placed or performed during the hospital encounter of 04/29/15 (from the  past 48 hour(s))  CBC     Status: None   Collection Time: 04/29/15  2:02 PM  Result Value Ref Range   WBC 10.5 4.0 - 10.5 K/uL   RBC 5.33 4.22 - 5.81 MIL/uL   Hemoglobin 15.9 13.0 - 17.0 g/dL   HCT 46.0 39.0 - 52.0 %   MCV 86.3 78.0 - 100.0 fL   MCH 29.8 26.0 - 34.0 pg   MCHC 34.6 30.0 - 36.0 g/dL   RDW 12.5 11.5 - 15.5 %   Platelets 226 150 - 400 K/uL  Comprehensive metabolic panel     Status: Abnormal   Collection Time: 04/29/15  2:02 PM  Result Value Ref Range   Sodium 138 135 -  145 mmol/L   Potassium 4.0 3.5 - 5.1 mmol/L   Chloride 103 101 - 111 mmol/L   CO2 28 22 - 32 mmol/L   Glucose, Bld 88 65 - 99 mg/dL   BUN 16 6 - 20 mg/dL   Creatinine, Ser 0.88 0.61 - 1.24 mg/dL   Calcium 9.5 8.9 - 10.3 mg/dL   Total Protein 7.7 6.5 - 8.1 g/dL   Albumin 4.6 3.5 - 5.0 g/dL   AST 16 15 - 41 U/L   ALT 14 (L) 17 - 63 U/L   Alkaline Phosphatase 63 38 - 126 U/L   Total Bilirubin 1.1 0.3 - 1.2 mg/dL   GFR calc non Af Amer >60 >60 mL/min   GFR calc Af Amer >60 >60 mL/min    Comment: (NOTE) The eGFR has been calculated using the CKD EPI equation. This calculation has not been validated in all clinical situations. eGFR's persistently <60 mL/min signify possible Chronic Kidney Disease.    Anion gap 7 5 - 15  Ethanol     Status: None   Collection Time: 04/29/15  2:02 PM  Result Value Ref Range   Alcohol, Ethyl (B) <5 <5 mg/dL    Comment:        LOWEST DETECTABLE LIMIT FOR SERUM ALCOHOL IS 5 mg/dL FOR MEDICAL PURPOSES ONLY   Salicylate level     Status: None   Collection Time: 04/29/15  2:02 PM  Result Value Ref Range   Salicylate Lvl <9.5 2.8 - 30.0 mg/dL  Acetaminophen level     Status: Abnormal   Collection Time: 04/29/15  2:02 PM  Result Value Ref Range   Acetaminophen (Tylenol), Serum <10 (L) 10 - 30 ug/mL    Comment:        THERAPEUTIC CONCENTRATIONS VARY SIGNIFICANTLY. A RANGE OF 10-30 ug/mL MAY BE AN EFFECTIVE CONCENTRATION FOR MANY PATIENTS. HOWEVER, SOME ARE BEST TREATED AT CONCENTRATIONS OUTSIDE THIS RANGE. ACETAMINOPHEN CONCENTRATIONS >150 ug/mL AT 4 HOURS AFTER INGESTION AND >50 ug/mL AT 12 HOURS AFTER INGESTION ARE OFTEN ASSOCIATED WITH TOXIC REACTIONS.   Urine rapid drug screen (hosp performed)     Status: Abnormal   Collection Time: 04/29/15  4:00 PM  Result Value Ref Range   Opiates NONE DETECTED NONE DETECTED   Cocaine NONE DETECTED NONE DETECTED   Benzodiazepines POSITIVE (A) NONE DETECTED   Amphetamines NONE DETECTED NONE DETECTED    Tetrahydrocannabinol POSITIVE (A) NONE DETECTED   Barbiturates NONE DETECTED NONE DETECTED    Comment:        DRUG SCREEN FOR MEDICAL PURPOSES ONLY.  IF CONFIRMATION IS NEEDED FOR ANY PURPOSE, NOTIFY LAB WITHIN 5 DAYS.        LOWEST DETECTABLE LIMITS FOR URINE DRUG SCREEN Drug Class  Cutoff (ng/mL) Amphetamine      1000 Barbiturate      200 Benzodiazepine   427 Tricyclics       062 Opiates          300 Cocaine          300 THC              50     Metabolic Disorder Labs:  No results found for: HGBA1C, MPG No results found for: PROLACTIN No results found for: CHOL, TRIG, HDL, CHOLHDL, VLDL, LDLCALC  Current Medications: Current Facility-Administered Medications  Medication Dose Route Frequency Provider Last Rate Last Dose  . acetaminophen (TYLENOL) tablet 650 mg  650 mg Oral Q6H PRN Harriet Butte, NP      . alum & mag hydroxide-simeth (MAALOX/MYLANTA) 200-200-20 MG/5ML suspension 30 mL  30 mL Oral Q4H PRN Harriet Butte, NP      . magnesium hydroxide (MILK OF MAGNESIA) suspension 30 mL  30 mL Oral Daily PRN Harriet Butte, NP      . prazosin (MINIPRESS) capsule 2 mg  2 mg Oral QHS Harriet Butte, NP   2 mg at 04/29/15 2224  . traZODone (DESYREL) tablet 50 mg  50 mg Oral QHS PRN Harriet Butte, NP   50 mg at 04/29/15 2224   PTA Medications: No prescriptions prior to admission    Musculoskeletal: Strength & Muscle Tone: within normal limits Gait & Station: normal Patient leans: N/A  Psychiatric Specialty Exam: Physical Exam  Vitals reviewed.   Review of Systems  All other systems reviewed and are negative.   Blood pressure 113/67, pulse 126, temperature 98 F (36.7 C), temperature source Oral, resp. rate 16.There is no weight on file to calculate BMI.  General Appearance: Neat  Eye Contact::  Good  Speech:  Normal Rate  Volume:  Normal  Mood:  Anxious and Euthymic  Affect:  Appropriate  Thought Process:  Coherent  Orientation:  Full (Time, Place,  and Person)  Thought Content:  Rumination  Suicidal Thoughts:  No  Homicidal Thoughts:  No  Memory:  Immediate;   Fair Recent;   Fair Remote;   Fair  Judgement:  Fair  Insight:  Fair  Psychomotor Activity:  Normal  Concentration:  Good  Recall:  Good  Fund of Knowledge:Good  Language: Good  Akathisia:  Negative  Handed:  Right  AIMS (if indicated):     Assets:  Desire for Improvement Resilience Social Support  ADL's:  Intact  Cognition: WNL  Sleep:  Number of Hours: 6.5     Treatment Plan Summary: Daily contact with patient to assess and evaluate symptoms and progress in treatment and Medication management  Observation Level/Precautions:  15 minute checks  Laboratory:  per ED  Psychotherapy:  group  Medications:  As per medlist, Minipress 2 mg daily for PTSD and anxiety.  Trazodone 50 mg sleep  Consultations:  safety  Discharge Concerns:  safety  Estimated LOS:  3-7 days  Other:     I certify that inpatient services furnished can reasonably be expected to improve the patient's condition.   Freda Munro May Agustin AGNP-BC 1/7/20173:45 PM  Patient seen face-to-face for psychiatric evaluation, case discussed with the staff and nurse practitioner and completed admission suicide risk assessment. Formulated treatment plan and reviewed the information documented and agree with the treatment plan.  Axten Pascucci,JANARDHAHA R. 05/01/2015 11:50 AM

## 2015-05-01 DIAGNOSIS — F332 Major depressive disorder, recurrent severe without psychotic features: Principal | ICD-10-CM

## 2015-05-01 NOTE — Plan of Care (Signed)
Problem: Alteration in mood Goal: LTG-Patient reports reduction in suicidal thoughts (Patient reports reduction in suicidal thoughts and is able to verbalize a safety plan for whenever patient is feeling suicidal)  Outcome: Progressing Pt denies SI at this time     

## 2015-05-01 NOTE — Progress Notes (Signed)
[facility] Group Notes:  (Nursing/MHT/Case Management/Adjunct)  Date:  05/01/2015  Time:  11:55 PM  Type of Therapy:  Psychoeducational Skills  Participation Level:  Active  Participation Quality:  Attentive  Affect:  Appropriate  Cognitive:  Appropriate  Insight:  Good  Engagement in Group:  Engaged  Modes of Intervention:  Education  Summary of Progress/Problems: The patient shared with the group that he had a "good" day overall. He explained that his day improved a great deal when he visited with his one year old child. In addition, he stated that he will be discharged this coming Tuesday possibly. In terms of the theme for the day, his support system will be comprised of his children, girlfriend, and brother.   Hazle CocaGOODMAN, James Meadows 05/01/2015, 11:55 PM

## 2015-05-01 NOTE — Progress Notes (Signed)
[facility] MD Progress Note  05/01/2015 6:36 AM James Meadows  MRN:  [id number]  Subjective:  Patient is smiling today and voiced no c/o.  He is not reporting adverse drug reaction and he is also not exhibiting disruptive behavior.  Principal Problem: Major depressive disorder, recurrent severe without psychotic features ([place]) Diagnosis:   Patient Active Problem List   Diagnosis Date Noted  . Suicidal ideation [R45.851]   . Overdose [T50.901A]   . Major depressive disorder, recurrent severe without psychotic features ([place]) [F33.2] 04/29/2015  . PTSD (post-traumatic stress disorder) [F43.10] 04/29/2015  . Opioid dependence ([place]) [F11.20] 04/29/2015  . Marijuana abuse [F12.10] 08/22/2012  . ADHD (attention deficit hyperactivity disorder) [F90.9] 08/22/2012  . Episodic mood disorder ([place]) [F39] 08/22/2012   Total Time spent with patient: 20 minutes  Past Psychiatric History: Depression  Past Medical History: History reviewed. No pertinent past medical history.  Past Surgical History  Procedure Laterality Date  . Finger surgery Right     pinky   Family History: History reviewed. No pertinent family history. Family Psychiatric  History:  Denies Social History:  History  Alcohol Use  . Yes    Comment: Occ     History  Drug Use  . Yes  . Special: Marijuana    Social History   Social History  . Marital Status: Single    Spouse Name: N/A  . Number of Children: N/A  . Years of Education: N/A   Social History Main Topics  . Smoking status: Current Some Day Smoker -- 0.02 packs/day for 6 years  . Smokeless tobacco: Never Used  . Alcohol Use: Yes     Comment: Occ  . Drug Use: Yes    Special: Marijuana  . Sexual Activity: Not Asked   Other Topics Concern  . None   Social History Narrative   Additional Social History:     Sleep: Good  Appetite:  Good  Current Medications: Current Facility-Administered Medications  Medication Dose Route Frequency Provider Last Rate  Last Dose  . acetaminophen (TYLENOL) tablet 650 mg  650 mg Oral Q6H PRN Harriet Butte, NP      . alum & mag hydroxide-simeth (MAALOX/MYLANTA) 200-200-20 MG/5ML suspension 30 mL  30 mL Oral Q4H PRN Harriet Butte, NP      . magnesium hydroxide (MILK OF MAGNESIA) suspension 30 mL  30 mL Oral Daily PRN Harriet Butte, NP      . prazosin (MINIPRESS) capsule 2 mg  2 mg Oral QHS Harriet Butte, NP   2 mg at 04/30/15 2111  . traZODone (DESYREL) tablet 50 mg  50 mg Oral QHS PRN Harriet Butte, NP   50 mg at 04/30/15 2112    Lab Results:  Results for orders placed or performed during the hospital encounter of 04/29/15 (from the past 48 hour(s))  CBC     Status: None   Collection Time: 04/29/15  2:02 PM  Result Value Ref Range   WBC 10.5 4.0 - 10.5 K/uL   RBC 5.33 4.22 - 5.81 MIL/uL   Hemoglobin 15.9 13.0 - 17.0 g/dL   HCT 46.0 39.0 - 52.0 %   MCV 86.3 78.0 - 100.0 fL   MCH 29.8 26.0 - 34.0 pg   MCHC 34.6 30.0 - 36.0 g/dL   RDW 12.5 11.5 - 15.5 %   Platelets 226 150 - 400 K/uL  Comprehensive metabolic panel     Status: Abnormal   Collection Time: 04/29/15  2:02 PM  Result Value Ref Range   Sodium 138 135 - 145 mmol/L   Potassium 4.0 3.5 - 5.1 mmol/L   Chloride 103 101 - 111 mmol/L   CO2 28 22 - 32 mmol/L   Glucose, Bld 88 65 - 99 mg/dL   BUN 16 6 - 20 mg/dL   Creatinine, Ser 6.34 0.61 - 1.24 mg/dL   Calcium 9.5 8.9 - 69.5 mg/dL   Total Protein 7.7 6.5 - 8.1 g/dL   Albumin 4.6 3.5 - 5.0 g/dL   AST 16 15 - 41 U/L   ALT 14 (L) 17 - 63 U/L   Alkaline Phosphatase 63 38 - 126 U/L   Total Bilirubin 1.1 0.3 - 1.2 mg/dL   GFR calc non Af Amer >60 >60 mL/min   GFR calc Af Amer >60 >60 mL/min    Comment: (NOTE) The eGFR has been calculated using the CKD EPI equation. This calculation has not been validated in all clinical situations. eGFR's persistently <60 mL/min signify possible Chronic Kidney Disease.    Anion gap 7 5 - 15  Ethanol     Status: None   Collection Time: 04/29/15   2:02 PM  Result Value Ref Range   Alcohol, Ethyl (B) <5 <5 mg/dL    Comment:        LOWEST DETECTABLE LIMIT FOR SERUM ALCOHOL IS 5 mg/dL FOR MEDICAL PURPOSES ONLY   Salicylate level     Status: None   Collection Time: 04/29/15  2:02 PM  Result Value Ref Range   Salicylate Lvl <4.0 2.8 - 30.0 mg/dL  Acetaminophen level     Status: Abnormal   Collection Time: 04/29/15  2:02 PM  Result Value Ref Range   Acetaminophen (Tylenol), Serum <10 (L) 10 - 30 ug/mL    Comment:        THERAPEUTIC CONCENTRATIONS VARY SIGNIFICANTLY. A RANGE OF 10-30 ug/mL MAY BE AN EFFECTIVE CONCENTRATION FOR MANY PATIENTS. HOWEVER, SOME ARE BEST TREATED AT CONCENTRATIONS OUTSIDE THIS RANGE. ACETAMINOPHEN CONCENTRATIONS >150 ug/mL AT 4 HOURS AFTER INGESTION AND >50 ug/mL AT 12 HOURS AFTER INGESTION ARE OFTEN ASSOCIATED WITH TOXIC REACTIONS.   Urine rapid drug screen (hosp performed)     Status: Abnormal   Collection Time: 04/29/15  4:00 PM  Result Value Ref Range   Opiates NONE DETECTED NONE DETECTED   Cocaine NONE DETECTED NONE DETECTED   Benzodiazepines POSITIVE (A) NONE DETECTED   Amphetamines NONE DETECTED NONE DETECTED   Tetrahydrocannabinol POSITIVE (A) NONE DETECTED   Barbiturates NONE DETECTED NONE DETECTED    Comment:        DRUG SCREEN FOR MEDICAL PURPOSES ONLY.  IF CONFIRMATION IS NEEDED FOR ANY PURPOSE, NOTIFY LAB WITHIN 5 DAYS.        LOWEST DETECTABLE LIMITS FOR URINE DRUG SCREEN Drug Class       Cutoff (ng/mL) Amphetamine      1000 Barbiturate      200 Benzodiazepine   200 Tricyclics       300 Opiates          300 Cocaine          300 THC              50     Physical Findings: AIMS: Facial and Oral Movements Muscles of Facial Expression: None, normal Lips and Perioral Area: None, normal Jaw: None, normal Tongue: None, normal,Extremity Movements Upper (arms, wrists, hands, fingers): None, normal Lower (legs, knees, ankles, toes): None, normal, Trunk Movements Neck,  shoulders, hips:  None, normal, Overall Severity Severity of abnormal movements (highest score from questions above): None, normal Incapacitation due to abnormal movements: None, normal Patient's awareness of abnormal movements (rate only patient's report): No Awareness, Dental Status Current problems with teeth and/or dentures?: No Does patient usually wear dentures?: No  CIWA:    COWS:     Musculoskeletal: Strength & Muscle Tone: within normal limits Gait & Station: normal Patient leans: N/A  Psychiatric Specialty Exam: Review of Systems  All other systems reviewed and are negative.   Blood pressure 130/73, pulse 126, temperature 98 F (36.7 C), temperature source Oral, resp. rate 16.There is no weight on file to calculate BMI.   General Appearance: Neat  Eye Contact:: Good  Speech: Normal Rate  Volume: Normal  Mood: Anxious and Euthymic  Affect: Appropriate  Thought Process: Coherent  Orientation: Full (Time, Place, and Person)  Thought Content: Rumination  Suicidal Thoughts: No  Homicidal Thoughts: No  Memory: Immediate; Fair Recent; Fair Remote; Fair  Judgement: Fair  Insight: Fair  Psychomotor Activity: Normal  Concentration: Good  Recall: Good  Fund of Knowledge:Good  Language: Good  Akathisia: Negative  Handed: Right  AIMS (if indicated):    Assets: Desire for Improvement Resilience Social Support  ADL's: Intact  Cognition: WNL  Sleep: Number of Hours: 6.5        Treatment Plan Summary: Review of chart, vital signs, medications, and notes.  1-Individual and group therapy  2-Medication management for depression and anxiety: Medications reviewed with the patient and stated no untoward effects, unchanged.  3-Coping skills for depression, anxiety  4-Continue crisis stabilization and management  5-Address health issues--monitoring vital signs, stable  6-Treatment plan in progress to prevent relapse  of depression and anxiety   Freda Munro May Agustin AGNP-BC 05/01/2015, 6:36 AM

## 2015-05-01 NOTE — [facility] Group Notes (Signed)
[facility] Group Notes:  (Clinical Social Work)   05/01/2015 1:15-2:15PM  Summary of Progress/Problems:   The main focus of today's process group was to   1)  discuss the importance of adding supports  2)  define health supports versus unhealthy supports  3)  identify the patient's current unhealthy supports and plan how to handle them  4)  Identify the patient's current healthy supports and plan what to add.  An emphasis was placed on using counselor, doctor, therapy groups, 12-step groups, and problem-specific support groups to expand supports.    The patient expressed full comprehension of the concepts presented, and agreed that there is a need to add more supports.  The patient stated his 3 small children and girlfriend are his healthy supports, while he himself and his bad habits are his most unhealthy supports.  Some of his comments had to be reframed for the group to remain focused on learning positively about healthy supports.  He enjoyed listening to some nature sounds at the end of group, felt relaxed by that.  Type of Therapy:  Process Group with Motivational Interviewing  Participation Level:  Active  Participation Quality:  Attentive and Sharing  Affect:  Blunted  Cognitive:  Appropriate and Oriented  Insight:  Developing/Improving  Engagement in Therapy:  Engaged  Modes of Intervention:   Education, Support and Processing, Activity  Ambrose MantleMareida Grossman-Orr, LCSW 05/01/2015   4:32 PM

## 2015-05-01 NOTE — [facility] Group Notes (Signed)
[facility] Group Notes:  (Nursing/MHT/Case Management/Adjunct)  Date:  05/01/2015  Time:  1100  Type of Therapy:  Nurse Education  /  Life SKills: The group is focused on teaching patients how to develop healthy support systmes.   Participation Level:  Active  Participation Quality:  Appropriate  Affect:  Appropriate  Cognitive:  Alert  Insight:  Appropriate  Engagement in Group:  Engaged  Modes of Intervention:  Education  Summary of Progress/Problems:  James Meadows, James Meadows 05/01/2015, 2:18 PM

## 2015-05-01 NOTE — Progress Notes (Signed)
D) Pt denies SI and HI. Pleasant and cooperative. Pt rates his depression at a 4, hopelessness at a 0 and his anxiety at a 0. Has attended the groups and participates. States "I never talk up in groups, but I did with these folks". Joking around a lot. Has limited insight into his issues and how his behavior impacts his lift. Tends to blame everyone else A) Given support and gently confronted about his blaming others and not taking responsibility for his own actions. Provided with a 1:1 R) continues to minimize his actions and blame others.

## 2015-05-01 NOTE — Progress Notes (Signed)
D: Pt denies SI/HI/AVH. Pt is pleasant and cooperative. Pt stated he was feeling better due to having some one to talk to and going to groups. PT stated he will sign up to get his GED and the think about going to [employer]. Pt plans to stop feeling sorry for himself and start changing his life.   A: Pt was offered support and encouragement. Pt was given scheduled medications. Pt was encourage to attend groups. Q 15 minute checks were done for safety.   R:Pt attends groups and interacts well with peers and staff. Pt is taking medication. Pt has no complaints at this time .Pt receptive to treatment and safety maintained on unit.
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
## 2015-05-02 MED ORDER — TRAZODONE HCL 50 MG PO TABS: 50.0000 mg | ORAL_TABLET | Freq: Every evening | ORAL | Status: DC | PRN

## 2015-05-02 MED ORDER — PRAZOSIN HCL 2 MG PO CAPS
2.0000 mg | ORAL_CAPSULE | Freq: Every day | ORAL | Status: DC
Start: 2015-05-02 — End: 2016-10-15

## 2015-05-02 NOTE — [facility] Suicide Risk Assessment (Signed)
[facility] INPATIENT:  Family/Significant Other Suicide Prevention Education  Suicide Prevention Education:  Education Completed; Alvera SinghCamera White, Pt's girlfriend [phone],  has been identified by the patient as the family member/significant other with whom the patient will be residing, and identified as the person(s) who will aid the patient in the event of a mental health crisis (suicidal ideations/suicide attempt).  With written consent from the patient, the family member/significant other has been provided the following suicide prevention education, prior to the and/or following the discharge of the patient.  The suicide prevention education provided includes the following:  Suicide risk factors  Suicide prevention and interventions  National Suicide Hotline telephone number  [facility] assessment telephone number  [facility] Emergency Assistance 911  [facility] and/or Residential Mobile Crisis Unit telephone number  Request made of family/significant other to:  Remove weapons (e.g., guns, rifles, knives), all items previously/currently identified as safety concern.    Remove drugs/medications (over-the-counter, prescriptions, illicit drugs), all items previously/currently identified as a safety concern.  The family member/significant other verbalizes understanding of the suicide prevention education information provided.  The family member/significant other agrees to remove the items of safety concern listed above.  Elaina Hoopsarter, Romonia Yanik M 05/02/2015, 10:34 AM

## 2015-05-02 NOTE — Plan of Care (Signed)
Problem: Alteration in mood Goal: LTG-Patient reports reduction in suicidal thoughts (Patient reports reduction in suicidal thoughts and is able to verbalize a safety plan for whenever patient is feeling suicidal)  Outcome: Progressing Patient denies SI.  Patient verbally contracts for safety.     

## 2015-05-02 NOTE — Progress Notes (Signed)
Discharge note: Pt received both written and verbal discharge instructions. Pt verbalized understanding of discharge instructions. Pt agreed to f/u appt and med regimen. Pt received sample meds, prescriptions and belongings. [facility]

## 2015-05-02 NOTE — Progress Notes (Signed)
Recreation Therapy Notes  Date: 01.09.2017 Time: 9:30am Location: 300 [address] Dayroom   Group Topic: Stress Management  Goal Area(s) Addresses:  Patient will actively participate in stress management techniques presented during session.   Behavioral Response: Appropriate, Engaged   Intervention: Stress management techniques  Activity :  Deep Breathing and Progressive Muscle Relaxation. LRT provided instruction and demonstration on practice of Progressive Muscle Relaxation. Technique was coupled with deep breathing.   Education:  Stress Management, Discharge Planning.   Education Outcome: Acknowledges education  Clinical Observations/Feedback: Patient actively engaged in technique introduced, expressed no concerns and demonstrated ability to practice independently post d/c.   Shandrell Boda L Aileene Lanum, LRT/CTRS        Chaun Uemura L 05/02/2015 2:54 PM 

## 2015-05-02 NOTE — [facility] Suicide Risk Assessment (Addendum)
[facility] Discharge Suicide Risk Assessment   Demographic Factors:  25 year old single male, lives with girlfriend, has three children  Total Time spent with patient: 30 minutes  Musculoskeletal: Strength & Muscle Tone: within normal limits Gait & Station: normal Patient leans: N/A  Psychiatric Specialty Exam: Physical Exam  ROS  Blood pressure 118/76, pulse 95, temperature 98.2 F (36.8 C), temperature source Oral, resp. rate 18.There is no weight on file to calculate BMI.  General Appearance: Well Groomed  Eye Contact::  Good  Speech:  Normal Rate409  Volume:  Normal  Mood:  denies depression at this time , states " mood is good today"  Affect:  Appropriate, reactive   Thought Process:  Linear  Orientation:  Full (Time, Place, and Person)  Thought Content:  denies hallucinations, no delusions   Suicidal Thoughts:  No denies any suicidal or self injurious ideations, denies any violent or homicidal ideations   Homicidal Thoughts:  No  Memory:  recent and remote grossly intact   Judgement:  Other:  improved  Insight:  improved  Psychomotor Activity:  Normal  Concentration:  Good  Recall:  Good  Fund of Knowledge:Good  Language: Good  Akathisia:  Negative  Handed:  Right  AIMS (if indicated):     Assets:  Communication Skills Desire for Improvement Resilience Social Support  Sleep:  Number of Hours: 5.75  Cognition: WNL  ADL's:  Intact   Have you used any form of tobacco in the last 30 days? (Cigarettes, Smokeless Tobacco, Cigars, and/or Pipes): No  Has this patient used any form of tobacco in the last 30 days? (Cigarettes, Smokeless Tobacco, Cigars, and/or Pipes) No  Mental Status Per Nursing Assessment::   On Admission:  Suicidal ideation indicated by patient  Current Mental Status by Physician: At this time patient is alert and attentive, well related, pleasant, calm, mood is " good " today, denies depression, affect is appropriate, no thought disorder, no SI or HI,  no psychotic symptoms , future oriented.   Loss Factors: GF committed suicide 4 years, around this time of year .   Historical Factors: History of depression , prior suicide attempt , history of cannabis dependence   Risk Reduction Factors:   Responsible for children under 72 years of age, Sense of responsibility to family, Living with another person, especially a relative, Positive social support and Positive coping skills or problem solving skills  Continued Clinical Symptoms:  As noted, patient improved compared to admission- currently no suicidal ideations, no homicidal ideations, no psychotic symptoms , denies medication side effects With patient's express consent I spoke with his GF, with whom he lives ( [place]) . She corroborates he seems much improved and states she feels he is in agreement with discharge . He is on Minipress for PTSD related nightmares - we discussed starting an antidepressant /SSRI as well, but states he feels improved, stable at present. Plans to continue medication management at [facility]..  Cognitive Features That Contribute To Risk:  No gross cognitive deficits noted upon discharge. Is alert , attentive, and oriented x 3   Suicide Risk:  Mild:  Suicidal ideation of limited frequency, intensity, duration, and specificity.  There are no identifiable plans, no associated intent, mild dysphoria and related symptoms, good self-control (both objective and subjective assessment), few other risk factors, and identifiable protective factors, including available and accessible social support.  Principal Problem: Major depressive disorder, recurrent severe without psychotic features [facility]) Discharge Diagnoses:  Patient Active Problem List  Diagnosis Date Noted  . Suicidal ideation [R45.851]   . Overdose [T50.901A]   . Major depressive disorder, recurrent severe without psychotic features ([facility]) [F33.2] 04/29/2015  . PTSD (post-traumatic stress disorder) [F43.10]  04/29/2015  . Opioid dependence ([facility]) [F11.20] 04/29/2015  . Marijuana abuse [F12.10] 08/22/2012  . ADHD (attention deficit hyperactivity disorder) [F90.9] 08/22/2012  . Episodic mood disorder ([facility]) [F39] 08/22/2012    Follow-up Information    [facility].   Why:  The office was closed due to inclement weather, so a time was not able to be confirmed. Please call tomorrow (1/10) to confirm appointment time.   Contact information:   [address] [place], [place] [address] Phone: [phone]      Plan Of Care/Follow-up recommendations:  Activity:  as tolerated  Diet:  Regular Tests:  NA Other:  See below   Is patient on multiple antipsychotic therapies at discharge:  No   Has Patient had three or more failed trials of antipsychotic monotherapy by history:  No  Recommended Plan for Multiple Antipsychotic Therapies: NA  Patient is leaving unit in good spirits. Plans to return home- lives with GF  Plans to follow up as above  Admiral Marcucci 05/02/2015, 1:16 PM

## 2015-05-02 NOTE — Discharge Summary (Signed)
Physician Discharge Summary Note  Patient:  James Meadows is an 25 y.o., male MRN:  [id number] DOB:  12-May-1990 Patient phone:  [phone] (home)  Patient address:   [address] [place] [address],  Total Time spent with patient: 30 minutes  Date of Admission:  04/29/2015 Date of Discharge: 05/02/2015  Reason for Admission:  Depression with suicidal ideation  Principal Problem: Major depressive disorder, recurrent severe without psychotic features [facility]) Discharge Diagnoses: Patient Active Problem List   Diagnosis Date Noted  . Suicidal ideation [R45.851]   . Overdose [T50.901A]   . Major depressive disorder, recurrent severe without psychotic features (HCC) [F33.2] 04/29/2015  . PTSD (post-traumatic stress disorder) [F43.10] 04/29/2015  . Opioid dependence (HCC) [F11.20] 04/29/2015  . Marijuana abuse [F12.10] 08/22/2012  . ADHD (attention deficit hyperactivity disorder) [F90.9] 08/22/2012  . Episodic mood disorder (HCC) [F39] 08/22/2012    Past Psychiatric History: See H &P  Past Medical History: History reviewed. No pertinent past medical history.  Past Surgical History  Procedure Laterality Date  . Finger surgery Right     pinky   Family History: History reviewed. No pertinent family history. Family Psychiatric  History:  Social History:  History  Alcohol Use  . Yes    Comment: Occ     History  Drug Use  . Yes  . Special: Marijuana    Social History   Social History  . Marital Status: Single    Spouse Name: N/A  . Number of Children: N/A  . Years of Education: N/A   Social History Main Topics  . Smoking status: Current Some Day Smoker -- 0.02 packs/day for 6 years  . Smokeless tobacco: Never Used  . Alcohol Use: Yes     Comment: Occ  . Drug Use: Yes    Special: Marijuana  . Sexual Activity: Not Asked   Other Topics Concern  . None   Social History Narrative    Hospital Course:    James Meadows is an 25 y.o. male patient was brought  into the ED by EMS called by family after he text them apologizing for pending death. Patient continued to endorse suicidal thoughts with plan to overdose on street sedative or other drugs. Patient reports severe depression due to grief from death of girlfriend 3 years that he found hanging. Patient reported still having nightmares of her face therefore he has stayed awake for days at a time due to remembering the trauma.          James Meadows was admitted to the adult 400 unit. He was evaluated and his symptoms were identified. Medication management was discussed and initiated. The patient was not listed as taking any psychiatric medications prior to admission. Patient was started on Minipress 2 mg at bedtime to address the nightmares and Trazodone 50 mg hs prn insomnia.  He was oriented to the unit and encouraged to participate in unit programming. Medical problems were identified and treated appropriately. Home medication was restarted as needed.        The patient was evaluated each day by a clinical provider to ascertain the patient's response to treatment.  Improvement was noted by the patient's report of decreasing symptoms, improved sleep and appetite, affect, medication tolerance, behavior, and participation in unit programming.  He was asked each day to complete a self inventory noting mood, mental status, pain, new symptoms, anxiety and concerns. He exhibited future planning by reporting to staff his intention to get his GED in order  to attend [employer] school. Patient denied any further suicidal thoughts to staff during assessments. He appeared motivated during group sessions to work on his recovery because of his three children and girlfriend.          He responded well to medication and being in a therapeutic and supportive environment. He informed nursing staff that talking to his peers and going to groups helped to improve his mood.  Positive and appropriate behavior was noted and the patient  was motivated for recovery.  The patient worked closely with the treatment team and case manager to develop a discharge plan with appropriate goals. Coping skills, problem solving as well as relaxation therapies were also part of the unit programming.         By the day of discharge he was in much improved condition than upon admission.  Symptoms were reported as significantly decreased or resolved completely. The patient denied SI/HI and voiced no AVH. He was motivated to continue taking medication with a goal of continued improvement in mental health.  James Meadows was discharged home with a plan to follow up as noted below. Patient was provided with a seven day supply of medications due to having no insurance.   Physical Findings: AIMS: Facial and Oral Movements Muscles of Facial Expression: None, normal Lips and Perioral Area: None, normal Jaw: None, normal Tongue: None, normal,Extremity Movements Upper (arms, wrists, hands, fingers): None, normal Lower (legs, knees, ankles, toes): None, normal, Trunk Movements Neck, shoulders, hips: None, normal, Overall Severity Severity of abnormal movements (highest score from questions above): None, normal Incapacitation due to abnormal movements: None, normal Patient's awareness of abnormal movements (rate only patient's report): No Awareness, Dental Status Current problems with teeth and/or dentures?: No Does patient usually wear dentures?: No  CIWA:    COWS:     Musculoskeletal: Strength & Muscle Tone: within normal limits Gait & Station: normal Patient leans: N/A  Psychiatric Specialty Exam: Review of Systems  Constitutional: Negative.   HENT: Negative.   Eyes: Negative.   Respiratory: Negative.   Cardiovascular: Negative.   Gastrointestinal: Negative.   Genitourinary: Negative.   Musculoskeletal: Negative.   Skin: Negative.   Neurological: Negative.   Endo/Heme/Allergies: Negative.   Psychiatric/Behavioral: Positive for depression  (Stable ) and substance abuse (Positive for marijuana on admission ). Negative for suicidal ideas, hallucinations and memory loss. The patient is not nervous/anxious and does not have insomnia.     Blood pressure 118/76, pulse 95, temperature 98.2 F (36.8 C), temperature source Oral, resp. rate 18.There is no weight on file to calculate BMI.  See Physician SRA     Have you used any form of tobacco in the last 30 days? (Cigarettes, Smokeless Tobacco, Cigars, and/or Pipes): No  Has this patient used any form of tobacco in the last 30 days? (Cigarettes, Smokeless Tobacco, Cigars, and/or Pipes)  No  Metabolic Disorder Labs:  No results found for: HGBA1C, MPG No results found for: PROLACTIN No results found for: CHOL, TRIG, HDL, CHOLHDL, VLDL, LDLCALC  See Psychiatric Specialty Exam and Suicide Risk Assessment completed by Attending Physician prior to discharge.  Discharge destination:  Home  Is patient on multiple antipsychotic therapies at discharge:  No   Has Patient had three or more failed trials of antipsychotic monotherapy by history:  No  Recommended Plan for Multiple Antipsychotic Therapies: NA     Medication List    TAKE these medications      Indication   prazosin 2 MG capsule  Commonly known as:  MINIPRESS  Take 1 capsule (2 mg total) by mouth at bedtime.   Indication:  PTSD symptoms     traZODone 50 MG tablet  Commonly known as:  DESYREL  Take 1 tablet (50 mg total) by mouth at bedtime as needed for sleep (May repeat x1).   Indication:  Trouble Sleeping       Follow-up Information    [facility].   Why:  The office was closed due to inclement weather, so a time was not able to be confirmed. Please call tomorrow (1/10) to confirm appointment time.   Contact information:   [address] [place], [place] [address] Phone: [phone]      Follow-up recommendations:   As above    Comments:   Take all your medications as  prescribed by your mental healthcare provider.  Report any adverse effects and or reactions from your medicines to your outpatient provider promptly.  Patient is instructed and cautioned to not engage in alcohol and or illegal drug use while on prescription medicines.  In the event of worsening symptoms, patient is instructed to call the crisis hotline, 911 and or go to the nearest ED for appropriate evaluation and treatment of symptoms.  Follow-up with your primary care provider for your other medical issues, concerns and or health care needs.   SignedFransisca Kaufmann: DAVIS, LAURA, NP-C 05/02/2015, 12:39 PM  Patient seen, Suicide Assessment Completed.  Disposition Plan Reviewed

## 2015-05-02 NOTE — Tx Team (Signed)
Interdisciplinary Treatment Plan Update (Adult) Date: 05/02/2015   Date: 05/02/2015 10:09 AM  Progress in Treatment:  Attending groups: Yes  Participating in groups: Yes  Taking medication as prescribed: Yes  Tolerating medication: Yes  Family/Significant othe contact made: No, CSW attempting to make contact with girlfriend Patient understands diagnosis: Yes Discussing patient identified problems/goals with staff: Yes  Medical problems stabilized or resolved: Yes  Denies suicidal/homicidal ideation: Yes Patient has not harmed self or Others: Yes   New problem(s) identified: None identified at this time.   Discharge Plan or Barriers: Pt will discharge to girlfriend's home and follow-up with [facility]  Additional comments:  Patient and CSW reviewed pt's identified goals and treatment plan. Patient verbalized understanding and agreed to treatment plan. CSW reviewed [facility] "Discharge Process and Patient Involvement" Form. Pt verbalized understanding of information provided and signed form.   Reason for Continuation of Hospitalization:  Depression Medication stabilization Suicidal ideation  Estimated length of stay: 0 days; Pt stable for DC today  Review of initial/current patient goals per problem list:   1.  Goal(s): Patient will participate in aftercare plan  Met:  Yes  Target date: 3-5 days from date of admission   As evidenced by: Patient will participate within aftercare plan AEB aftercare provider and housing plan at discharge being identified.  05/02/2015: Pt will discharge to girlfriend's home and follow[facility]up [facility]  2.  Goal (s): Patient will exhibit decreased depressive symptoms and suicidal ideations.  Met:  Yes  Target date: 3-5 days from date of admission   As evidenced by: Patient will utilize self rating of depression at 3 or below and demonstrate decreased signs of depression or be deemed stable for discharge by MD. 05/02/2015: Pt denies depression, rates it at  0/10; denies SI  Attendees:  Patient:    Family:    Physician: Dr. Parke Poisson, MD  05/02/2015 10:09 AM  Nursing: Lars Pinks, RN Case manager  05/02/2015 10:09 AM  Clinical Social Worker Peri Maris, [place] 05/02/2015 10:09 AM  Other: Maxie Better , [place] 05/02/2015 10:09 AM  Clinical: Darrol Angel, RN 05/02/2015 10:09 AM  Other: , RN Charge Nurse 05/02/2015 10:09 AM  Other: Hilda Lias, [place], [place] Work [phone]

## 2015-05-02 NOTE — Progress Notes (Signed)
  [facility] Discharge [facility] :  Will you be returning to the same living situation after discharge:  No. Pt discharging to girlfriend's house At discharge, do you have transportation home?: Yes,  Pt girlfriend to provide transportation Do you have the ability to pay for your medications: Yes,  Pt provided with 7-day supply and prescriptions  Release of information consent forms completed and in the chart;  Patient's signature needed at discharge.  Patient to Follow up at: Follow-up Information    [facility] with [facility] On 05/04/2015.   Why:  The office was closed due to inclement weather, so a time was not able to be confirmed. Please call tomorrow (1/10) to confirm appointment time.   Contact information:   [address] [place], [place] [address] Phone: [phone]      Next level of care provider has access to [facility] Link:no  Safety Planning and Suicide Prevention discussed: Yes,  with girlfriend; see SPE note for further details  Have you used any form of tobacco in the last 30 days? (Cigarettes, Smokeless Tobacco, Cigars, and/or Pipes): No  Has patient been referred to the Quitline?: N/A patient is not a smoker  Patient has been referred for addiction treatment: Yes- see above  Elaina HoopsCarter, Raford Brissett M 05/02/2015, 10:18 AM

## 2015-05-13 ENCOUNTER — Encounter (HOSPITAL_COMMUNITY): Payer: Self-pay

## 2015-05-13 ENCOUNTER — Emergency Department (HOSPITAL_COMMUNITY)
Admission: EM | Admit: 2015-05-13 | Discharge: 2015-05-13 | Disposition: A | Discharge status: Home / Self Care | Payer: Medicaid Other | Attending: Emergency Medicine | Admitting: Emergency Medicine

## 2015-05-13 DIAGNOSIS — S4991XA Unspecified injury of right shoulder and upper arm, initial encounter: Secondary | ICD-10-CM | POA: Diagnosis not present

## 2015-05-13 DIAGNOSIS — F17200 Nicotine dependence, unspecified, uncomplicated: Secondary | ICD-10-CM | POA: Diagnosis not present

## 2015-05-13 DIAGNOSIS — X500XXA Overexertion from strenuous movement or load, initial encounter: Secondary | ICD-10-CM | POA: Insufficient documentation

## 2015-05-13 DIAGNOSIS — Y9289 Other specified places as the place of occurrence of the external cause: Secondary | ICD-10-CM | POA: Diagnosis not present

## 2015-05-13 DIAGNOSIS — Y998 Other external cause status: Secondary | ICD-10-CM | POA: Insufficient documentation

## 2015-05-13 DIAGNOSIS — Y9389 Activity, other specified: Secondary | ICD-10-CM | POA: Insufficient documentation

## 2015-05-13 DIAGNOSIS — S4992XA Unspecified injury of left shoulder and upper arm, initial encounter: Secondary | ICD-10-CM | POA: Diagnosis present

## 2015-05-13 DIAGNOSIS — Z91040 Latex allergy status: Secondary | ICD-10-CM | POA: Diagnosis not present

## 2015-05-13 DIAGNOSIS — T148XXA Other injury of unspecified body region, initial encounter: Secondary | ICD-10-CM

## 2015-05-13 DIAGNOSIS — T148 Other injury of unspecified body region: Secondary | ICD-10-CM | POA: Diagnosis not present

## 2015-05-13 MED ORDER — TRAMADOL HCL 50 MG PO TABS: 50.0000 mg | ORAL_TABLET | Freq: Four times a day (QID) | ORAL | Status: DC | PRN

## 2015-05-13 MED ORDER — CYCLOBENZAPRINE HCL 10 MG PO TABS: 10.0000 mg | ORAL_TABLET | Freq: Three times a day (TID) | ORAL | Status: DC | PRN

## 2015-05-13 NOTE — ED Provider Notes (Signed)
CSN: [id number]     Arrival date & time 05/13/15  1548 History   First MD Initiated Contact with Patient 05/13/15 1612     Chief Complaint  Patient presents with  . Shoulder Pain     (Consider location/radiation/quality/duration/timing/severity/associated sxs/prior Treatment) HPI   James Meadows is a 25 y.o. male who presents to the Emergency Department complaining of bilateral shoulder and arm pain for 4 days.  He states his pain began shortly after lifting dumbbell weights.  He states he now has pain with raising or lowering his arms.  He describes the pain as aching and "soreness"  Pain improves at rest.  He has not tried any medications or therapies for symptom relief.  He denies trauma, neck pain, swelling, headaches, numbness or weakness of the upper extremities.     History reviewed. No pertinent past medical history. Past Surgical History  Procedure Laterality Date  . Finger surgery Right     pinky   No family history on file. Social History  Substance Use Topics  . Smoking status: Current Some Day Smoker -- 0.02 packs/day for 6 years  . Smokeless tobacco: Never Used  . Alcohol Use: No    Review of Systems  Constitutional: Negative for fever and chills.  Respiratory: Negative for shortness of breath.   Cardiovascular: Negative for chest pain.  Gastrointestinal: Negative for nausea and vomiting.  Genitourinary: Negative for dysuria, hematuria, flank pain and difficulty urinating.  Musculoskeletal: Positive for myalgias (bilateral shoulder and arm pain). Negative for joint swelling, neck pain and neck stiffness.  Skin: Negative for color change and wound.  Neurological: Negative for dizziness, weakness, numbness and headaches.  All other systems reviewed and are negative.     Allergies  Oxycodone; Ibuprofen; and Latex  Home Medications   Prior to Admission medications   Medication Sig Start Date End Date Taking? Authorizing Provider  prazosin (MINIPRESS) 2 MG  capsule Take 1 capsule (2 mg total) by mouth at bedtime. 05/02/15   Thermon Leyland, NP  traZODone (DESYREL) 50 MG tablet Take 1 tablet (50 mg total) by mouth at bedtime as needed for sleep (May repeat x1). 05/02/15   Thermon Leyland, NP   BP 117/65 mmHg  Pulse 76  Temp(Src) 98.7 F (37.1 C) (Tympanic)  Ht  (1.676 m)  Wt 63.957 kg  BMI 22.77 kg/m2  SpO2 100% Physical Exam  Constitutional: He is oriented to person, place, and time. He appears well-developed and well-nourished. No distress.  HENT:  Head: Atraumatic.  Neck: Normal range of motion. Neck supple.  Cardiovascular: Normal rate, regular rhythm and intact distal pulses.   No murmur heard. Pulmonary/Chest: Effort normal and breath sounds normal. No respiratory distress. He exhibits no tenderness.  Musculoskeletal: Normal range of motion. He exhibits tenderness.  Diffuse ttp of the bilateral bicep, deltoid, and trapezius muscles.  Bicep tendons appear intact.  No bony tenderness or deformity.  Pain reproduced through ROM.  Radial pulses brisk, distal sensation intact.  Compartments soft.  Neurological: He is alert and oriented to person, place, and time. He exhibits normal muscle tone. Coordination normal.  Skin: Skin is warm. No rash noted.  Nursing note and vitals reviewed.   ED Course  Procedures (including critical care time) Labs Review Labs Reviewed - No data to display  Imaging Review No results found. I have personally reviewed and evaluated these images and lab results as part of my medical decision-making.   EKG Interpretation None  MDM   Final diagnoses:  Muscle strain    Pain to bilateral deltoids, biceps and trapezius muscles.  NV intact.  Pain through ROM.  No cervical tenderness.  No reported trauma to indicate need for imaging.  Likely musculoskeletal injury.  Pt agrees to symptomatic tx with ultram and flexeril, and ice,.  Appears stable for d/c and return precautions given.      Pauline Aus, PA-C 05/13/15 1644  Cathren Laine, MD 05/14/15 Jacinta Shoe

## 2015-05-13 NOTE — ED Notes (Signed)
Pt reports has had pain in both shoulders since working out 3 days ago.  Pain worse with movement.

## 2016-06-19 ENCOUNTER — Emergency Department (HOSPITAL_COMMUNITY)
Admission: EM | Admit: 2016-06-19 | Discharge: 2016-06-19 | Disposition: A | Discharge status: Home / Self Care | Payer: Medicaid Other | Attending: Emergency Medicine | Admitting: Emergency Medicine

## 2016-06-19 ENCOUNTER — Encounter (HOSPITAL_COMMUNITY): Payer: Self-pay | Admitting: *Deleted

## 2016-06-19 DIAGNOSIS — F17200 Nicotine dependence, unspecified, uncomplicated: Secondary | ICD-10-CM | POA: Insufficient documentation

## 2016-06-19 DIAGNOSIS — F909 Attention-deficit hyperactivity disorder, unspecified type: Secondary | ICD-10-CM | POA: Insufficient documentation

## 2016-06-19 DIAGNOSIS — L02411 Cutaneous abscess of right axilla: Secondary | ICD-10-CM | POA: Insufficient documentation

## 2016-06-19 MED ORDER — LIDOCAINE HCL (PF) 2 % IJ SOLN
INTRAMUSCULAR | Status: AC
  Filled 2016-06-19: qty 10

## 2016-06-19 MED ORDER — PROMETHAZINE HCL 12.5 MG PO TABS
12.5000 mg | ORAL_TABLET | Freq: Once | ORAL | Status: AC
  Administered 2016-06-19: 12.5 mg via ORAL
  Filled 2016-06-19: qty 1

## 2016-06-19 MED ORDER — ACETAMINOPHEN 500 MG PO TABS
1000.0000 mg | ORAL_TABLET | Freq: Once | ORAL | Status: AC
  Administered 2016-06-19: 1000 mg via ORAL
  Filled 2016-06-19: qty 2

## 2016-06-19 MED ORDER — POVIDONE-IODINE 10 % EX SOLN
CUTANEOUS | Status: AC
  Filled 2016-06-19: qty 118

## 2016-06-19 MED ORDER — DOXYCYCLINE HYCLATE 100 MG PO CAPS: 100.0000 mg | ORAL_CAPSULE | Freq: Two times a day (BID) | ORAL | 0 refills | Status: DC

## 2016-06-19 MED ORDER — TRAMADOL HCL 50 MG PO TABS: ORAL_TABLET | ORAL | 0 refills | Status: DC

## 2016-06-19 MED ORDER — DOXYCYCLINE HYCLATE 100 MG PO TABS
100.0000 mg | ORAL_TABLET | Freq: Once | ORAL | Status: AC
  Administered 2016-06-19: 100 mg via ORAL
  Filled 2016-06-19: qty 1

## 2016-06-19 NOTE — Discharge Instructions (Signed)
Use doxycycline 2 times daily with food until all taken. Use Tylenol extra strength for mild pain, use Ultram for more severe pain. Please soak the wound in warm Epsom salt water daily for about 15-20 minutes until it heals from the inside out. Change dressing daily.

## 2016-06-19 NOTE — ED Triage Notes (Signed)
Pt comes in with right underarm abscess that started 2 weeks ago. Pt states he had a small amount of drainage yesterday. NAD noted.

## 2016-06-19 NOTE — ED Provider Notes (Signed)
[facility]-EM[facility]Y DEPT Provider Note   CSN: [id number] Arrival date & time: 06/19/16  1048     History   Chief Complaint Chief Complaint  Patient presents with  . Abscess    HPI James Meadows is a 26 y.o. male.  Patient is a 26 year old male who presents to the emergency department with an abscess of the right under arm. The patient states that this probably started about 2 weeks ago with small pimple, and now it has become a large abscess. He has pain with certain movements of his arm. He states he can't put pressure on it like holding his little daughter because it hurts. He has not had fever or chills reported. He's not had previous problem with abscess in this area. Patient denies diabetes.      History reviewed. No pertinent past medical history.  Patient Active Problem List   Diagnosis Date Noted  . Suicidal ideation   . Overdose   . Major depressive disorder, recurrent severe without psychotic features (HCC) 04/29/2015  . PTSD (post-traumatic stress disorder) 04/29/2015  . Opioid dependence (HCC) 04/29/2015  . Marijuana abuse 08/22/2012  . ADHD (attention deficit hyperactivity disorder) 08/22/2012  . Episodic mood disorder (HCC) 08/22/2012    Past Surgical History:  Procedure Laterality Date  . FINGER SURGERY Right    pinky       Home Medications    Prior to Admission medications   Medication Sig Start Date End Date Taking? Authorizing Provider  traZODone (DESYREL) 50 MG tablet Take 1 tablet (50 mg total) by mouth at bedtime as needed for sleep (May repeat x1). 05/02/15  Yes Thermon Leyland, NP  cyclobenzaprine (FLEXERIL) 10 MG tablet Take 1 tablet (10 mg total) by mouth 3 (three) times daily as needed. Patient not taking: Reported on 06/19/2016 05/13/15   Tammy Triplett, PA-C  doxycycline (VIBRAMYCIN) 100 MG capsule Take 1 capsule (100 mg total) by mouth 2 (two) times daily. 06/19/16   Ivery Quale, PA-C  prazosin (MINIPRESS) 2 MG capsule Take 1 capsule (2 mg  total) by mouth at bedtime. 05/02/15   Thermon Leyland, NP  traMADol Janean Sark) 50 MG tablet 1 or 2 po q6h prn pain 06/19/16   Ivery Quale, PA-C    Family History No family history on file.  Social History Social History  Substance Use Topics  . Smoking status: Current Some Day Smoker  . Smokeless tobacco: Never Used  . Alcohol use Yes     Comment: occ.      Allergies   Oxycodone; Ibuprofen; and Latex   Review of Systems Review of Systems  Constitutional: Negative for activity change.       All ROS Neg except as noted in HPI  HENT: Negative for nosebleeds.   Eyes: Negative for photophobia and discharge.  Respiratory: Negative for cough, shortness of breath and wheezing.   Cardiovascular: Negative for chest pain and palpitations.  Gastrointestinal: Negative for abdominal pain and blood in stool.  Genitourinary: Negative for dysuria, frequency and hematuria.  Musculoskeletal: Negative for arthralgias, back pain and neck pain.  Skin: Negative.        abscess  Neurological: Negative for dizziness, seizures and speech difficulty.  Psychiatric/Behavioral: Negative for confusion and hallucinations.     Physical Exam Updated Vital Signs BP 126/89 (BP Location: Right Arm)   Pulse 72   Temp 98.4 F (36.9 C) (Oral)   Resp 18   Ht 5\' 7"  (1.702 m)   Wt 71.7 kg   SpO2  100%   BMI 24.75 kg/m   Physical Exam  Constitutional: He is oriented to person, place, and time. He appears well-developed and well-nourished.  Non-toxic appearance.  HENT:  Head: Normocephalic.  Right Ear: Tympanic membrane and external ear normal.  Left Ear: Tympanic membrane and external ear normal.  Eyes: EOM and lids are normal. Pupils are equal, round, and reactive to light.  Neck: Normal range of motion. Neck supple. Carotid bruit is not present.  Cardiovascular: Normal rate, regular rhythm, normal heart sounds, intact distal pulses and normal pulses.   Pulmonary/Chest: Breath sounds normal. No  respiratory distress.  Abdominal: Soft. Bowel sounds are normal. There is no tenderness. There is no guarding.  Musculoskeletal: Normal range of motion.  Abscess of the right axilla. No red streaks appreciated. The area is warm but not hot.  Lymphadenopathy:       Head (right side): No submandibular adenopathy present.       Head (left side): No submandibular adenopathy present.    He has no cervical adenopathy.  Neurological: He is alert and oriented to person, place, and time. He has normal strength. No cranial nerve deficit or sensory deficit.  Skin: Skin is warm and dry.  Psychiatric: He has a normal mood and affect. His speech is normal.  Nursing note and vitals reviewed.    ED Treatments / Results  Labs (all labs ordered are listed, but only abnormal results are displayed) Labs Reviewed  AEROBIC CULTURE (SUPERFICIAL SPECIMEN)    EKG  EKG Interpretation None       Radiology No results found.  Procedures .Marland KitchenIncision and Drainage Date/Time: 06/19/2016 1:46 PM Performed by: Ivery Quale Authorized by: Ivery Quale   Consent:    Consent obtained:  Verbal   Consent given by:  Patient   Risks discussed:  Bleeding, pain and infection   Alternatives discussed:  Referral Location:    Type:  Abscess   Location:  Upper extremity   Upper extremity location: right axilla. Pre-procedure details:    Skin preparation:  Betadine Anesthesia (see MAR for exact dosages):    Anesthesia method:  Local infiltration   Local anesthetic:  Lidocaine 2% w/o epi Procedure type:    Complexity:  Simple Procedure details:    Needle aspiration: no     Incision types:  Single straight   Incision depth:  Subcutaneous   Scalpel blade:  11   Wound management:  Irrigated with saline   Drainage:  Purulent   Drainage amount:  Moderate   Wound treatment:  Wound left open   Packing materials:  None Post-procedure details:    Patient tolerance of procedure:  Tolerated well, no immediate  complications   (including critical care time)  Medications Ordered in ED Medications  lidocaine (XYLOCAINE) 2 % injection (not administered)  povidone-iodine (BETADINE) 10 % external solution (not administered)  doxycycline (VIBRA-TABS) tablet 100 mg (not administered)  acetaminophen (TYLENOL) tablet 1,000 mg (not administered)  promethazine (PHENERGAN) tablet 12.5 mg (not administered)     Initial Impression / Assessment and Plan / ED Course  I have reviewed the triage vital signs and the nursing notes.  Pertinent labs & imaging results that were available during my care of the patient were reviewed by me and considered in my medical decision making (see chart for details).     **I have reviewed nursing notes, vital signs, and all appropriate lab and imaging results for this patient.*  Final Clinical Impressions(s) / ED Diagnoses MDM Vital signs within normal  limits. Patient has an abscess of the right eczema. Incision and drainage carried out, and culture sent to the lab. I've instructed the patient on warm Epsom salt soaks until wound heals from the inside out. I've also instructed the patient to use Tylenol every 4 hours, Ultram every 6 hours for pain prescription for doxycycline is also given to the patient.    Final diagnoses:  Abscess of axilla, right    New Prescriptions New Prescriptions   DOXYCYCLINE (VIBRAMYCIN) 100 MG CAPSULE    Take 1 capsule (100 mg total) by mouth 2 (two) times daily.   TRAMADOL (ULTRAM) 50 MG TABLET    1 or 2 po q6h prn pain     Ivery QualeHobson Brazen Domangue, PA-C 06/19/16 1438    Samuel JesterKathleen McManus, DO 06/24/16 [phone]

## 2016-06-23 LAB — AEROBIC CULTURE W GRAM STAIN (SUPERFICIAL SPECIMEN)

## 2016-06-23 LAB — AEROBIC CULTURE  (SUPERFICIAL SPECIMEN): CULTURE: NORMAL

## 2016-06-24 ENCOUNTER — Telehealth: Payer: Self-pay | Admitting: *Deleted

## 2016-06-24 NOTE — Telephone Encounter (Signed)
Post ED Visit - Positive Culture Follow-up  Culture report reviewed by antimicrobial stewardship pharmacist:  []  Enzo BiNathan Batchelder, Pharm.D. []  Celedonio MiyamotoJeremy Frens, Pharm.D., BCPS [x]  Garvin FilaMike Maccia, Pharm.D. []  Georgina PillionElizabeth Martin, Pharm.D., BCPS []  GentryMinh Pham, [address]Pharm.D., BCPS, AAHIVP []  Estella HuskMichelle Turner, Pharm.D., BCPS, AAHIVP []  Tennis Mustassie Stewart, Pharm.D. []  Sherle Poeob Vincent, [address]Pharm[address]D.  Positive aerobic culture Treated with Doxycycline Hyclate, organism sensitive to the same and no further patient follow-up is required at this time.  Virl AxeRobertson, Pistol Kessenich [facility] 06/24/2016, 10:28 AM

## 2016-10-15 ENCOUNTER — Emergency Department (HOSPITAL_COMMUNITY)
Admission: EM | Admit: 2016-10-15 | Discharge: 2016-10-15 | Disposition: A | Discharge status: Home / Self Care | Payer: Medicaid Other | Attending: Emergency Medicine | Admitting: Emergency Medicine

## 2016-10-15 ENCOUNTER — Encounter (HOSPITAL_COMMUNITY): Payer: Self-pay | Admitting: Emergency Medicine

## 2016-10-15 DIAGNOSIS — Z87891 Personal history of nicotine dependence: Secondary | ICD-10-CM | POA: Insufficient documentation

## 2016-10-15 DIAGNOSIS — R300 Dysuria: Secondary | ICD-10-CM | POA: Insufficient documentation

## 2016-10-15 DIAGNOSIS — Z91040 Latex allergy status: Secondary | ICD-10-CM | POA: Insufficient documentation

## 2016-10-15 DIAGNOSIS — Z202 Contact with and (suspected) exposure to infections with a predominantly sexual mode of transmission: Secondary | ICD-10-CM | POA: Insufficient documentation

## 2016-10-15 LAB — URINALYSIS, ROUTINE W REFLEX MICROSCOPIC
BILIRUBIN URINE: NEGATIVE
Glucose, UA: NEGATIVE mg/dL
KETONES UR: NEGATIVE mg/dL
Nitrite: NEGATIVE
PH: 6 (ref 5.0–8.0)
Protein, ur: NEGATIVE mg/dL
Specific Gravity, Urine: 1.018 (ref 1.005–1.030)

## 2016-10-15 MED ORDER — CEFTRIAXONE SODIUM 250 MG IJ SOLR
250.0000 mg | Freq: Once | INTRAMUSCULAR | Status: AC
  Administered 2016-10-15: 250 mg via INTRAMUSCULAR
  Filled 2016-10-15: qty 250

## 2016-10-15 MED ORDER — LIDOCAINE HCL (PF) 1 % IJ SOLN
INTRAMUSCULAR | Status: AC
  Administered 2016-10-15: 16:00:00
  Filled 2016-10-15: qty 5

## 2016-10-15 MED ORDER — AZITHROMYCIN 250 MG PO TABS
1000.0000 mg | ORAL_TABLET | Freq: Once | ORAL | Status: AC
  Administered 2016-10-15: 1000 mg via ORAL
  Filled 2016-10-15: qty 4

## 2016-10-15 NOTE — ED Triage Notes (Signed)
Patient states his girlfriend was treated for gonorrhea yesterday. Complains of pain with urination.

## 2016-10-15 NOTE — Discharge Instructions (Signed)
Follow-up with the health dept if needed

## 2016-10-16 LAB — GC/CHLAMYDIA PROBE AMP (~~LOC~~) NOT AT ARMC
Chlamydia: POSITIVE — AB
NEISSERIA GONORRHEA: POSITIVE — AB

## 2016-10-17 LAB — URINE CULTURE: Culture: NO GROWTH

## 2016-10-17 NOTE — ED Provider Notes (Signed)
[facility] Provider Note   CSN: [id number] Arrival date & time: 10/15/16  1158     History   Chief Complaint Chief Complaint  Patient presents with  . SEXUALLY TRANSMITTED DISEASE    HPI James Meadows is a 26 y.o. male.  HPI   James Meadows is a 26 y.o. male who presents to the Emergency Department requesting evaluation for STD.  States that he was recently notified that his sexual partner has gonorrhea.  Last sexual encounter was several days ago.  Patient reports intermittent burning with urination but denies rash, lesions, abd pain, back pain, testicular pain or swelling.  History reviewed. No pertinent past medical history.  Patient Active Problem List   Diagnosis Date Noted  . Suicidal ideation   . Overdose   . Major depressive disorder, recurrent severe without psychotic features (HCC) 04/29/2015  . PTSD (post-traumatic stress disorder) 04/29/2015  . Opioid dependence (HCC) 04/29/2015  . Marijuana abuse 08/22/2012  . ADHD (attention deficit hyperactivity disorder) 08/22/2012  . Episodic mood disorder (HCC) 08/22/2012    Past Surgical History:  Procedure Laterality Date  . FINGER SURGERY Right    pinky       Home Medications    Prior to Admission medications   Medication Sig Start Date End Date Taking? Authorizing Provider  doxycycline (VIBRAMYCIN) 100 MG capsule Take 1 capsule (100 mg total) by mouth 2 (two) times daily. Patient not taking: Reported on 10/15/2016 06/19/16   Ivery Quale, PA-C    Family History History reviewed. No pertinent family history.  Social History Social History  Substance Use Topics  . Smoking status: Former Games developer  . Smokeless tobacco: Never Used  . Alcohol use Yes     Comment: occ.      Allergies   Oxycodone; Ibuprofen; and Latex   Review of Systems Review of Systems  Constitutional: Negative for activity change, appetite change, chills and fever.  Respiratory: Negative for chest tightness and  shortness of breath.   Gastrointestinal: Negative for abdominal pain, nausea and vomiting.  Genitourinary: Positive for dysuria, frequency and urgency. Negative for decreased urine volume, difficulty urinating, discharge, flank pain, hematuria, scrotal swelling and testicular pain.       Burning with urination  Musculoskeletal: Negative for back pain.  Skin: Negative for rash.  Neurological: Negative for dizziness, weakness and numbness.  Hematological: Negative for adenopathy.  Psychiatric/Behavioral: Negative for confusion.  All other systems reviewed and are negative.    Physical Exam Updated Vital Signs BP 105/62 (BP Location: Left Arm)   Pulse 96   Temp 98.1 F (36.7 C) (Oral)   Resp 18   Ht 5\' 8"  (1.727 m)   Wt 74.8 kg (165 lb)   SpO2 99%   BMI 25.09 kg/m   Physical Exam  Constitutional: He is oriented to person, place, and time. He appears well-developed and well-nourished. No distress.  HENT:  Head: Normocephalic and atraumatic.  Cardiovascular: Normal rate, regular rhythm, normal heart sounds and intact distal pulses.   No murmur heard. Pulmonary/Chest: Effort normal and breath sounds normal. No respiratory distress. He has no wheezes. He has no rales.  Abdominal: Soft. Normal appearance. He exhibits no distension and no mass. There is no hepatosplenomegaly. There is no tenderness. There is no rigidity, no rebound, no guarding, no CVA tenderness and no tenderness at McBurney's point. Hernia confirmed negative in the right inguinal area and confirmed negative in the left inguinal area.  Genitourinary: Testes normal. Cremasteric reflex is present. Right  testis shows no mass and no swelling. Left testis shows no mass and no swelling. Uncircumcised. No phimosis or paraphimosis. No discharge found.  Genitourinary Comments: Exam chaperoned by nursing.  No testicular edema or tenderness.  No genital lesions.  No penile discharge.  Musculoskeletal: Normal range of motion. He  exhibits no edema.  Neurological: He is alert and oriented to person, place, and time. No sensory deficit. Coordination normal.  Skin: Skin is warm and dry. No rash noted.  Psychiatric: He has a normal mood and affect.  Nursing note and vitals reviewed.    ED Treatments / Results  Labs (all labs ordered are listed, but only abnormal results are displayed) Labs Reviewed  URINALYSIS, ROUTINE W REFLEX MICROSCOPIC - Abnormal; Notable for the following:       Result Value   APPearance CLOUDY (*)    Hgb urine dipstick SMALL (*)    Leukocytes, UA LARGE (*)    Bacteria, UA RARE (*)    Squamous Epithelial / LPF 0-5 (*)    All other components within normal limits  GC/CHLAMYDIA PROBE AMP ([place] - Abnormal; Notable for the following:    Chlamydia **POSITIVE** (*)    Neisseria gonorrhea **POSITIVE** (*)    All other components within normal limits  URINE CULTURE    EKG  EKG Interpretation None       Radiology No results found.  Procedures Procedures (including critical care time)  Medications Ordered in ED Medications  cefTRIAXone (ROCEPHIN) injection 250 mg (250 mg Intramuscular Given 10/15/16 1559)  azithromycin (ZITHROMAX) tablet 1,000 mg (1,000 mg Oral Given 10/15/16 1558)  lidocaine (PF) (XYLOCAINE) 1 % injection (  Given 10/15/16 1601)     Initial Impression / Assessment and Plan / ED Course  I have reviewed the triage vital signs and the nursing notes.  Pertinent labs & imaging results that were available during my care of the patient were reviewed by me and considered in my medical decision making (see chart for details).     Pt well appearing.  Vitals stable, treated here for possible STD.  Referral to health dept.    Final Clinical Impressions(s) / ED Diagnoses   Final diagnoses:  STD exposure    New Prescriptions Discharge Medication List as of 10/15/2016  4:28 PM       Pauline Ausriplett, Cherylin Waguespack, PA-C 10/17/16 Audrea [id number]    Zammit, Joseph,  MD 10/19/16 1015

## 2016-12-14 ENCOUNTER — Emergency Department (HOSPITAL_COMMUNITY)
Admission: EM | Admit: 2016-12-14 | Discharge: 2016-12-14 | Disposition: A | Discharge status: Home / Self Care | Payer: Medicaid Other | Attending: Emergency Medicine | Admitting: Emergency Medicine

## 2016-12-14 ENCOUNTER — Emergency Department (HOSPITAL_COMMUNITY): Payer: Medicaid Other

## 2016-12-14 DIAGNOSIS — R109 Unspecified abdominal pain: Secondary | ICD-10-CM | POA: Insufficient documentation

## 2016-12-14 DIAGNOSIS — Z87891 Personal history of nicotine dependence: Secondary | ICD-10-CM | POA: Insufficient documentation

## 2016-12-14 DIAGNOSIS — N342 Other urethritis: Secondary | ICD-10-CM | POA: Insufficient documentation

## 2016-12-14 LAB — CBC WITH DIFFERENTIAL/PLATELET
Basophils Absolute: 0 10*3/uL (ref 0.0–0.1)
Basophils Relative: 0 %
EOS ABS: 0.1 10*3/uL (ref 0.0–0.7)
EOS PCT: 1 %
HCT: 41.3 % (ref 39.0–52.0)
HEMOGLOBIN: 13.9 g/dL (ref 13.0–17.0)
LYMPHS PCT: 23 %
Lymphs Abs: 2.7 10*3/uL (ref 0.7–4.0)
MCH: 29.4 pg (ref 26.0–34.0)
MCHC: 33.7 g/dL (ref 30.0–36.0)
MCV: 87.3 fL (ref 78.0–100.0)
MONOS PCT: 7 %
Monocytes Absolute: 0.8 10*3/uL (ref 0.1–1.0)
Neutro Abs: 8.2 10*3/uL — ABNORMAL HIGH (ref 1.7–7.7)
Neutrophils Relative %: 69 %
PLATELETS: 215 10*3/uL (ref 150–400)
RBC: 4.73 MIL/uL (ref 4.22–5.81)
RDW: 12.4 % (ref 11.5–15.5)
WBC: 11.8 10*3/uL — AB (ref 4.0–10.5)

## 2016-12-14 LAB — COMPREHENSIVE METABOLIC PANEL
ALBUMIN: 3.8 g/dL (ref 3.5–5.0)
ALT: 15 U/L — AB (ref 17–63)
AST: 16 U/L (ref 15–41)
Alkaline Phosphatase: 66 U/L (ref 38–126)
Anion gap: 7 (ref 5–15)
BUN: 11 mg/dL (ref 6–20)
CHLORIDE: 106 mmol/L (ref 101–111)
CO2: 27 mmol/L (ref 22–32)
CREATININE: 0.74 mg/dL (ref 0.61–1.24)
Calcium: 8.8 mg/dL — ABNORMAL LOW (ref 8.9–10.3)
GFR calc Af Amer: >60 mL/min (ref >60)
GFR calc non Af Amer: >60 mL/min (ref >60)
Glucose, Bld: 125 mg/dL — ABNORMAL HIGH (ref 65–99)
POTASSIUM: 3.5 mmol/L (ref 3.5–5.1)
SODIUM: 140 mmol/L (ref 135–145)
Total Bilirubin: 0.7 mg/dL (ref 0.3–1.2)
Total Protein: 6.8 g/dL (ref 6.5–8.1)

## 2016-12-14 LAB — URINALYSIS, ROUTINE W REFLEX MICROSCOPIC
Bilirubin Urine: NEGATIVE
Glucose, UA: NEGATIVE mg/dL
KETONES UR: NEGATIVE mg/dL
Nitrite: POSITIVE — AB
PROTEIN: 30 mg/dL — AB
Specific Gravity, Urine: 1.016 (ref 1.005–1.030)
pH: 5 (ref 5.0–8.0)

## 2016-12-14 MED ORDER — CEFTRIAXONE SODIUM 250 MG IJ SOLR
250.0000 mg | Freq: Once | INTRAMUSCULAR | Status: AC
  Administered 2016-12-14: 250 mg via INTRAMUSCULAR
  Filled 2016-12-14: qty 250

## 2016-12-14 MED ORDER — LIDOCAINE HCL (PF) 1 % IJ SOLN
INTRAMUSCULAR | Status: AC
  Administered 2016-12-14: 0.9 mL
  Filled 2016-12-14: qty 5

## 2016-12-14 MED ORDER — DOXYCYCLINE HYCLATE 100 MG PO CAPS: 100.0000 mg | ORAL_CAPSULE | Freq: Two times a day (BID) | ORAL | 0 refills | Status: AC

## 2016-12-14 NOTE — ED Provider Notes (Signed)
[facility] Provider Note   CSN: [id number] Arrival date & time: 12/14/16  1825     History   Chief Complaint Chief Complaint  Patient presents with  . Flank Pain    HPI James Meadows is a 26 y.o. male who presents today for evaluation of pain in his left sided lower posterior ribs. He reports that this began about one week ago.  Reports that it has been gradually getting worse since then. His pain increases with movement, coughing and is made better by sitting still. He has not tried any interventions prior to arrival. He does not remember any history of trauma however notes that it was his birthday yesterday so he has been celebrating his birthday for the past week.  He denies any fevers, nausea/vomiting, or chills. He denies any blood in his urine or penile discharge. He reports that the pain goes down his left leg and into his groin. No personal history of kidney stones.  He is able to walk without difficulty or pain.  He denies any abdominal pain.   HPI  No past medical history on file.  Patient Active Problem List   Diagnosis Date Noted  . Suicidal ideation   . Overdose   . Major depressive disorder, recurrent severe without psychotic features (HCC) 04/29/2015  . PTSD (post-traumatic stress disorder) 04/29/2015  . Opioid dependence (HCC) 04/29/2015  . Marijuana abuse 08/22/2012  . ADHD (attention deficit hyperactivity disorder) 08/22/2012  . Episodic mood disorder (HCC) 08/22/2012    Past Surgical History:  Procedure Laterality Date  . FINGER SURGERY Right    pinky       Home Medications    Prior to Admission medications   Medication Sig Start Date End Date Taking? Authorizing Provider  doxycycline (VIBRAMYCIN) 100 MG capsule Take 1 capsule (100 mg total) by mouth 2 (two) times daily. 12/14/16 12/21/16  Cristina Gong, PA-C    Family History No family history on file.  Social History Social History  Substance Use Topics  . Smoking status:  Former Games developer  . Smokeless tobacco: Never Used  . Alcohol use Yes     Comment: occ.      Allergies   Oxycodone; Ibuprofen; and Latex   Review of Systems Review of Systems  Constitutional: Negative for chills, fatigue and fever.  Respiratory: Negative for chest tightness and shortness of breath.   Gastrointestinal: Negative for abdominal pain, blood in stool, nausea and vomiting.  Genitourinary: Positive for flank pain (left flank). Negative for discharge, dysuria, frequency, hematuria, penile pain, testicular pain and urgency.  Musculoskeletal:       Left posterior rib pain     Physical Exam Updated Vital Signs BP 128/66 (BP Location: Right Arm)   Pulse 84   Temp 99.1 F (37.3 C) (Oral)   Ht 5\' 8"  (1.727 m)   Wt 65.8 kg (145 lb)   SpO2 98%   BMI 22.05 kg/m   Physical Exam  Constitutional: He appears well-developed and well-nourished.  HENT:  Head: Normocephalic and atraumatic.  Cardiovascular: Normal rate.   Pulmonary/Chest: Effort normal and breath sounds normal. No respiratory distress. He has no wheezes.  Abdominal: Soft. Bowel sounds are normal. He exhibits no distension and no mass. There is no tenderness. There is no guarding.  Musculoskeletal:  TPP over left inferior/posterior ribs. Palpation both re-creates and exacerbates his pain.  Bilateral lower extremities are warm and well perfused.  No obvious deformities, crepitus, ecchymosis, or edema noted to area.  Skin:  He is not diaphoretic.  No obvious skin breaks or wounds over the area of pain  Nursing note and vitals reviewed.    ED Treatments / Results  Labs (all labs ordered are listed, but only abnormal results are displayed) Labs Reviewed  URINALYSIS, ROUTINE W REFLEX MICROSCOPIC - Abnormal; Notable for the following:       Result Value   APPearance HAZY (*)    Hgb urine dipstick LARGE (*)    Protein, ur 30 (*)    Nitrite POSITIVE (*)    Leukocytes, UA MODERATE (*)    Bacteria, UA RARE (*)     Squamous Epithelial / LPF 0-5 (*)    All other components within normal limits  COMPREHENSIVE METABOLIC PANEL - Abnormal; Notable for the following:    Glucose, Bld 125 (*)    Calcium 8.8 (*)    ALT 15 (*)    All other components within normal limits  CBC WITH DIFFERENTIAL/PLATELET - Abnormal; Notable for the following:    WBC 11.8 (*)    Neutro Abs 8.2 (*)    All other components within normal limits  URINE CULTURE  GC/CHLAMYDIA PROBE AMP ([place]) NOT AT [facility]    EKG  EKG Interpretation None       Radiology Dg Ribs Unilateral W/chest Left  Result Date: 12/14/2016 CLINICAL DATA:  Left rib pain for 1 week without known injury. EXAM: LEFT RIBS AND CHEST - 3+ VIEW COMPARISON:  Radiographs of February 20, 2011. FINDINGS: No fracture or other bone lesions are seen involving the ribs. There is no evidence of pneumothorax or pleural effusion. Both lungs are clear. Heart size and mediastinal contours are within normal limits. IMPRESSION: Normal left ribs.  No acute cardiopulmonary abnormality seen. Electronically Signed   By: Lupita Raider, M.D.   On: 12/14/2016 19:21   Ct Renal Stone Study  Result Date: 12/14/2016 CLINICAL DATA:  Left flank pain. EXAM: CT ABDOMEN AND PELVIS WITHOUT CONTRAST TECHNIQUE: Multidetector CT imaging of the abdomen and pelvis was performed following the standard protocol without IV contrast. COMPARISON:  07/27/2009 FINDINGS: Lower chest:  Unremarkable. Hepatobiliary: No focal abnormality in the liver on this study without intravenous contrast. There is no evidence for gallstones, gallbladder wall thickening, or pericholecystic fluid. No intrahepatic or extrahepatic biliary dilation. Pancreas: No focal mass lesion. No dilatation of the main duct. No intraparenchymal cyst. No peripancreatic edema. Spleen: No splenomegaly. No focal mass lesion. Adrenals/Urinary Tract: No adrenal nodule or mass. Kidneys unremarkable. Specifically, no evidence for nephrolithiasis. No  secondary changes in either kidney. No evidence for hydroureter or ureteral stone. The urinary bladder appears normal for the degree of distention. No bladder stone. Stomach/Bowel: Stomach is distended with food. Duodenum is normally positioned as is the ligament of Treitz. No small bowel wall thickening. No small bowel dilatation. The terminal ileum is normal. The appendix is normal. No gross colonic mass. No colonic wall thickening. No substantial diverticular change. Vascular/Lymphatic: No abdominal aortic aneurysm. No abdominal aortic atherosclerotic calcification. There is no gastrohepatic or hepatoduodenal ligament lymphadenopathy. No intraperitoneal or retroperitoneal lymphadenopathy. No pelvic sidewall lymphadenopathy. Reproductive: The prostate gland and seminal vesicles have normal imaging features. Other: No intraperitoneal free fluid. Musculoskeletal: Bone windows reveal no worrisome lytic or sclerotic osseous lesions. IMPRESSION: 1. No acute findings in the abdomen or pelvis. Specifically, no findings to explain the patient's history of left-sided pain. Electronically Signed   By: Kennith Center M.D.   On: 12/14/2016 20:16    Procedures Procedures (  including critical care time)  Medications Ordered in ED Medications  cefTRIAXone (ROCEPHIN) injection 250 mg (250 mg Intramuscular Given 12/14/16 2217)  lidocaine (PF) (XYLOCAINE) 1 % injection (0.9 mLs  Given 12/14/16 2217)     Initial Impression / Assessment and Plan / ED Course  I have reviewed the triage vital signs and the nursing notes.  Pertinent labs & imaging results that were available during my care of the patient were reviewed by me and considered in my medical decision making (see chart for details).    Antoney Christiana Fuchs presents with Left-sided flank pain.  X-rays were obtained which did not show obvious rib fractures. Based on pain location UA was obtained which appears consistent with infection. CT renal stone study was performed  which did not show kidney stones, or hydro-nephrosis or hydroureter consistent with kidney stone.  CT did not show evidence of rib fractures in area of pain.  Based on infected urine and urine culture was sent along with gonorrhea/chlamydia tests from the urine.  Patient was treated with Rocephin IM, and given Rx for doxycycline to treat presumed STD.  As patient does not have symptoms consistent with UTI suspect that his urinalysis is more consistent with infectious urethritis from gonorrhea or chlamydia.  Patient is aware that he has cultures pending and that they will take at least 48 hours to resolve.  He was given follow-up with the health Department.    Suspect that patients flank pain is musculoskeletal in nature. Based on patient being afebrile, without dysuria, urgency, frequency, or other urinary symptoms I have low suspicion for pyelonephritis causing his back/flank pain.  This is further supported by patient being tender to palpation in this area, as I would not expect light pressure to aggravate pain from pyelonephritis.   At this time there does not appear to be any evidence of an acute emergency medical condition and the patient appears stable for discharge with appropriate outpatient follow up.Diagnosis was discussed with patient who verbalizes understanding and is agreeable to discharge. Pt case discussed with Dr. Clarene Duke who agrees with my plan.     Final Clinical Impressions(s) / ED Diagnoses   Final diagnoses:  Left flank pain  Infective urethritis    New Prescriptions Discharge Medication List as of 12/14/2016  9:40 PM       Cristina Gong, PA-C 12/15/16 0036    Samuel Jester, DO 12/20/16 1510

## 2016-12-14 NOTE — ED Triage Notes (Signed)
C/o of left rib pain, states he did not fall or hit ribs no trauma to rib area

## 2016-12-14 NOTE — Discharge Instructions (Signed)
Today you have been treated for gonorrhea and chlamydia.  The test to determine if you have these will take a few days. They will only call you if your tests come back positive, no news is good news. In the result that your tests are positive you have already been treated.  You may have diarrhea from the antibiotics.  It is very important that you continue to take the antibiotics even if you get diarrhea unless a medical professional tells you that you may stop taking them.  If you stop too early the bacteria you are being treated for will become stronger and you may need different, more powerful antibiotics that have more side effects and worsening diarrhea.  Please stay well hydrated and consider probiotics as they may decrease the severity of your diarrhea.    Even if your tests today come back negative I want you to follow up with the health Department in 10 days.

## 2016-12-17 LAB — URINE CULTURE

## 2016-12-17 LAB — GC/CHLAMYDIA PROBE AMP (~~LOC~~) NOT AT ARMC
Chlamydia: NEGATIVE
NEISSERIA GONORRHEA: NEGATIVE

## 2016-12-18 ENCOUNTER — Telehealth: Payer: Self-pay | Admitting: *Deleted

## 2016-12-18 NOTE — Telephone Encounter (Signed)
Post ED Visit - Positive Culture Follow-up: Successful Patient Follow-Up  Culture assessed and recommendations reviewed by: []  Enzo Bi, Pharm.D. []  Celedonio Miyamoto, Pharm.D., BCPS AQ-ID []  Garvin Fila, Pharm.D., BCPS []  Georgina Pillion, [address].D., BCPS []  Home, [address].D., BCPS, AAHIVP []  Estella Husk, Pharm.D., BCPS, AAHIVP []  Lysle Pearl, PharmD, BCPS []  Casilda Carls, PharmD, BCPS []  Pollyann Samples, PharmD, BCPS  Positive urine culture  []  Patient discharged without antimicrobial prescription and treatment is now indicated []  Organism is resistant to prescribed ED discharge antimicrobial []  Patient with positive blood cultures  Changes discussed with ED provider, Frederik Pear, PA-C;  Plan:  Asymptomatic bacteruria, no treatment indicated.  Contacted patient, date 12/18/2016, time 1004   Virl Axe [facility] 12/18/2016, 10:02 AM

## 2016-12-18 NOTE — Progress Notes (Signed)
ED Antimicrobial Stewardship Positive Culture Follow Up   James Meadows is an 26 y.o. male who presented to [facility] on 12/14/2016 with a chief complaint of  Chief Complaint  Patient presents with  . Flank Pain    Recent Results (from the past 720 hour(s))  Urine culture     Status: Abnormal   Collection Time: 12/14/16  6:42 PM  Result Value Ref Range Status   Specimen Description URINE, CLEAN CATCH  Final   Special Requests NONE  Final   Culture >=100,000 COLONIES/mL ESCHERICHIA COLI (A)  Final   Report Status 12/17/2016 FINAL  Final   Organism ID, Bacteria ESCHERICHIA COLI (A)  Final      Susceptibility   Escherichia coli - MIC*    AMPICILLIN >=32 RESISTANT Resistant     CEFAZOLIN <=4 SENSITIVE Sensitive     CEFTRIAXONE <=1 SENSITIVE Sensitive     CIPROFLOXACIN <=0.25 SENSITIVE Sensitive     GENTAMICIN >=16 RESISTANT Resistant     IMIPENEM <=0.25 SENSITIVE Sensitive     NITROFURANTOIN <=16 SENSITIVE Sensitive     TRIMETH/SULFA >=320 RESISTANT Resistant     AMPICILLIN/SULBACTAM 16 INTERMEDIATE Intermediate     PIP/TAZO <=4 SENSITIVE Sensitive     Extended ESBL NEGATIVE Sensitive     * >=100,000 COLONIES/mL ESCHERICHIA COLI    Plan: Asymptomatic bacteruria. Does not require treatment with antibiotics.  ED Provider: Frederik Pear, PA-C   Roderic Scarce Zigmund Daniel, PharmD PGY1 Pharmacy Resident Pager: [phone]

## 2017-04-11 ENCOUNTER — Emergency Department (HOSPITAL_COMMUNITY)
Admission: EM | Admit: 2017-04-11 | Discharge: 2017-04-11 | Disposition: A | Discharge status: Home / Self Care | Payer: Medicaid Other | Attending: Emergency Medicine | Admitting: Emergency Medicine

## 2017-04-11 ENCOUNTER — Telehealth: Payer: Self-pay | Admitting: Nurse Practitioner

## 2017-04-11 ENCOUNTER — Emergency Department (HOSPITAL_COMMUNITY): Payer: Medicaid Other

## 2017-04-11 ENCOUNTER — Encounter (HOSPITAL_COMMUNITY): Payer: Self-pay | Admitting: Emergency Medicine

## 2017-04-11 DIAGNOSIS — K2990 Gastroduodenitis, unspecified, without bleeding: Secondary | ICD-10-CM | POA: Insufficient documentation

## 2017-04-11 DIAGNOSIS — K920 Hematemesis: Secondary | ICD-10-CM | POA: Diagnosis present

## 2017-04-11 DIAGNOSIS — F17210 Nicotine dependence, cigarettes, uncomplicated: Secondary | ICD-10-CM | POA: Insufficient documentation

## 2017-04-11 DIAGNOSIS — K2970 Gastritis, unspecified, without bleeding: Secondary | ICD-10-CM | POA: Diagnosis not present

## 2017-04-11 LAB — COMPREHENSIVE METABOLIC PANEL
ALK PHOS: 70 U/L (ref 38–126)
ALT: 12 U/L — AB (ref 17–63)
AST: 19 U/L (ref 15–41)
Albumin: 4.9 g/dL (ref 3.5–5.0)
Anion gap: 12 (ref 5–15)
BUN: 14 mg/dL (ref 6–20)
CALCIUM: 9.9 mg/dL (ref 8.9–10.3)
CO2: 28 mmol/L (ref 22–32)
CREATININE: 0.97 mg/dL (ref 0.61–1.24)
Chloride: 97 mmol/L — ABNORMAL LOW (ref 101–111)
GFR calc non Af Amer: >60 mL/min (ref >60)
Glucose, Bld: 95 mg/dL (ref 65–99)
Potassium: 3.4 mmol/L — ABNORMAL LOW (ref 3.5–5.1)
SODIUM: 137 mmol/L (ref 135–145)
Total Bilirubin: 1.2 mg/dL (ref 0.3–1.2)
Total Protein: 8.6 g/dL — ABNORMAL HIGH (ref 6.5–8.1)

## 2017-04-11 LAB — CBC WITH DIFFERENTIAL/PLATELET
Basophils Absolute: 0 10*3/uL (ref 0.0–0.1)
Basophils Relative: 0 %
EOS ABS: 0 10*3/uL (ref 0.0–0.7)
Eosinophils Relative: 0 %
HCT: 47.8 % (ref 39.0–52.0)
HEMOGLOBIN: 15.9 g/dL (ref 13.0–17.0)
LYMPHS ABS: 2.2 10*3/uL (ref 0.7–4.0)
LYMPHS PCT: 22 %
MCH: 29.2 pg (ref 26.0–34.0)
MCHC: 33.3 g/dL (ref 30.0–36.0)
MCV: 87.7 fL (ref 78.0–100.0)
Monocytes Absolute: 0.9 10*3/uL (ref 0.1–1.0)
Monocytes Relative: 9 %
NEUTROS PCT: 69 %
Neutro Abs: 6.8 10*3/uL (ref 1.7–7.7)
Platelets: 217 10*3/uL (ref 150–400)
RBC: 5.45 MIL/uL (ref 4.22–5.81)
RDW: 12.3 % (ref 11.5–15.5)
WBC: 10 10*3/uL (ref 4.0–10.5)

## 2017-04-11 LAB — PROTIME-INR
INR: 1.01
Prothrombin Time: 13.2 seconds (ref 11.4–15.2)

## 2017-04-11 MED ORDER — HYDROMORPHONE HCL 1 MG/ML IJ SOLN
0.5000 mg | Freq: Once | INTRAMUSCULAR | Status: AC
  Administered 2017-04-11: 0.5 mg via INTRAVENOUS
  Filled 2017-04-11: qty 1

## 2017-04-11 MED ORDER — SODIUM CHLORIDE 0.9 % IV BOLUS (SEPSIS)
1000.0000 mL | Freq: Once | INTRAVENOUS | Status: AC
  Administered 2017-04-11: 1000 mL via INTRAVENOUS

## 2017-04-11 MED ORDER — PANTOPRAZOLE SODIUM 40 MG IV SOLR
40.0000 mg | Freq: Once | INTRAVENOUS | Status: AC
  Administered 2017-04-11: 40 mg via INTRAVENOUS
  Filled 2017-04-11: qty 40

## 2017-04-11 MED ORDER — ONDANSETRON 4 MG PO TBDP: ORAL_TABLET | ORAL | 0 refills | Status: DC

## 2017-04-11 MED ORDER — PANTOPRAZOLE SODIUM 20 MG PO TBEC: 20.0000 mg | DELAYED_RELEASE_TABLET | Freq: Every day | ORAL | 0 refills | Status: DC

## 2017-04-11 MED ORDER — ONDANSETRON HCL 4 MG/2ML IJ SOLN
4.0000 mg | Freq: Once | INTRAMUSCULAR | Status: AC
  Administered 2017-04-11: 4 mg via INTRAVENOUS
  Filled 2017-04-11: qty 2

## 2017-04-11 MED ORDER — TRAMADOL HCL 50 MG PO TABS: 50.0000 mg | ORAL_TABLET | Freq: Four times a day (QID) | ORAL | 0 refills | Status: DC | PRN

## 2017-04-11 NOTE — ED Notes (Signed)
Pt alert & oriented x4, stable gait. Patient given discharge instructions, paperwork & prescription(s). Patient  instructed to stop at the registration desk to finish any additional paperwork. Patient verbalized understanding. Pt left department w/ no further questions. 

## 2017-04-11 NOTE — Discharge Instructions (Signed)
Follow-up with Dr. Kendell Baneourke or a family doctor in the next 2 weeks.  Return if any problems.  Do not drink alcohol anymore

## 2017-04-11 NOTE — ED Triage Notes (Signed)
Patient states "I drunk alcohol for the first time 3 days ago and every since I have been throwing up blood." Also complaining of abdominal pain.

## 2017-04-11 NOTE — ED Provider Notes (Signed)
[facility] EMERGENCY DEPARTMENT Provider Note   CSN: [id number] Arrival date & time: 04/11/17  1245     History   Chief Complaint Chief Complaint  Patient presents with  . Hematemesis    HPI James Meadows is a 26 y.o. male.  Patient complains of vomiting blood after drinking a lot of alcohol.  He is having some abdominal discomfort   The history is provided by the patient.  Emesis   This is a new problem. The current episode started more than 2 days ago. The problem occurs 2 to 4 times per day. The problem has not changed since onset.The emesis has an appearance of stomach contents. There has been no fever. The fever has been present for 1 to 2 days. Pertinent negatives include no abdominal pain, no arthralgias, no cough, no diarrhea and no headaches.    History reviewed. No pertinent past medical history.  Patient Active Problem List   Diagnosis Date Noted  . Suicidal ideation   . Overdose   . Major depressive disorder, recurrent severe without psychotic features (HCC) 04/29/2015  . PTSD (post-traumatic stress disorder) 04/29/2015  . Opioid dependence (HCC) 04/29/2015  . Marijuana abuse 08/22/2012  . ADHD (attention deficit hyperactivity disorder) 08/22/2012  . Episodic mood disorder (HCC) 08/22/2012    Past Surgical History:  Procedure Laterality Date  . FINGER SURGERY Right    pinky       Home Medications    Prior to Admission medications   Medication Sig Start Date End Date Taking? Authorizing Provider  ondansetron (ZOFRAN ODT) 4 MG disintegrating tablet 4mg  ODT q4 hours prn nausea/vomit 04/11/17   Bethann BerkshireZammit, Yuliana Vandrunen, MD  pantoprazole (PROTONIX) 20 MG tablet Take 1 tablet (20 mg total) by mouth daily. 04/11/17   Bethann BerkshireZammit, Mataio Mele, MD  traMADol (ULTRAM) 50 MG tablet Take 1 tablet (50 mg total) by mouth every 6 (six) hours as needed. 04/11/17   Bethann BerkshireZammit, Kinston Magnan, MD    Family History History reviewed. No pertinent family history.  Social History Social History    Tobacco Use  . Smoking status: Current Every Day Smoker  . Smokeless tobacco: Never Used  Substance Use Topics  . Alcohol use: Yes    Comment: occ.   . Drug use: Yes    Types: Marijuana     Allergies   Oxycodone; Ibuprofen; and Latex   Review of Systems Review of Systems  Constitutional: Negative for appetite change and fatigue.  HENT: Negative for congestion, ear discharge and sinus pressure.   Eyes: Negative for discharge.  Respiratory: Negative for cough.   Cardiovascular: Negative for chest pain.  Gastrointestinal: Positive for vomiting. Negative for abdominal pain and diarrhea.       Vomiting blood  Genitourinary: Negative for frequency and hematuria.  Musculoskeletal: Negative for arthralgias and back pain.  Skin: Negative for rash.  Neurological: Negative for seizures and headaches.  Psychiatric/Behavioral: Negative for hallucinations.     Physical Exam Updated Vital Signs BP (!) 100/56 (BP Location: Left Arm)   Pulse 80   Temp 99.5 F (37.5 C) (Oral)   Resp 16   Wt 68 kg (150 lb)   SpO2 100%   BMI 22.81 kg/m   Physical Exam  Constitutional: He is oriented to person, place, and time. He appears well-developed.  HENT:  Head: Normocephalic.  Eyes: Conjunctivae and EOM are normal. No scleral icterus.  Neck: Neck supple. No thyromegaly present.  Cardiovascular: Normal rate and regular rhythm. Exam reveals no gallop and no friction rub.  No murmur heard. Pulmonary/Chest: No stridor. He has no wheezes. He has no rales. He exhibits no tenderness.  Abdominal: He exhibits no distension. There is tenderness. There is no rebound.  Musculoskeletal: Normal range of motion. He exhibits no edema.  Lymphadenopathy:    He has no cervical adenopathy.  Neurological: He is oriented to person, place, and time. He exhibits normal muscle tone. Coordination normal.  Skin: No rash noted. No erythema.  Psychiatric: He has a normal mood and affect. His behavior is normal.       ED Treatments / Results  Labs (all labs ordered are listed, but only abnormal results are displayed) Labs Reviewed  COMPREHENSIVE METABOLIC PANEL - Abnormal; Notable for the following components:      Result Value   Potassium 3.4 (*)    Chloride 97 (*)    Total Protein 8.6 (*)    ALT 12 (*)    All other components within normal limits  CBC WITH DIFFERENTIAL/PLATELET  PROTIME-INR    EKG  EKG Interpretation None       Radiology Dg Abd Acute W/chest  Result Date: 04/11/2017 CLINICAL DATA:  Abdominal pain and vomiting blood. Symptoms since drinking half a gallon of tequila 2 days ago. Initial encounter. EXAM: DG ABDOMEN ACUTE W/ 1V CHEST COMPARISON:  Abdominal CT 12/14/2016 FINDINGS: There is no edema, consolidation, effusion, or pneumothorax. Normal heart size and mediastinal contours. Normal bowel gas pattern. No evidence of pneumoperitoneum. No concerning mass effect or calcification. Thoracolumbar scoliosis. IMPRESSION: 1. Negative abdominal radiographs. No acute cardiopulmonary disease. 2. Thoracolumbar scoliosis. Electronically Signed   By: Marnee SpringJonathon  Watts M.D.   On: 04/11/2017 14:11    Procedures Procedures (including critical care time)  Medications Ordered in ED Medications  sodium chloride 0.9 % bolus 1,000 mL (1,000 mLs Intravenous New Bag/Given 04/11/17 1324)  pantoprazole (PROTONIX) injection 40 mg (40 mg Intravenous Given 04/11/17 1330)  ondansetron (ZOFRAN) injection 4 mg (4 mg Intravenous Given 04/11/17 1330)  HYDROmorphone (DILAUDID) injection 0.5 mg (0.5 mg Intravenous Given 04/11/17 1330)     Initial Impression / Assessment and Plan / ED Course  I have reviewed the triage vital signs and the nursing notes.  Pertinent labs & imaging results that were available during my care of the patient were reviewed by me and considered in my medical decision making (see chart for details).     Patient with gastritis.  He will placed on Protonix and given  Ultram and Zofran and will follow up with GI or family doctor  Final Clinical Impressions(s) / ED Diagnoses   Final diagnoses:  Gastritis and gastroduodenitis    ED Discharge Orders        Ordered    pantoprazole (PROTONIX) 20 MG tablet  Daily     04/11/17 1515    traMADol (ULTRAM) 50 MG tablet  Every 6 hours PRN     04/11/17 1517    ondansetron (ZOFRAN ODT) 4 MG disintegrating tablet     04/11/17 1517       Bethann BerkshireZammit, Allean Montfort, MD 04/11/17 1524

## 2017-04-11 NOTE — Telephone Encounter (Signed)
Pt was seen in the ER today and was told to call us to make FU OV in 2 weeks. Please advise if we can accept him as a new patient. The girlfriend said we could us her address [address], [place] but not to change his address. He will be staying with her for awhile.

## 2017-04-12 NOTE — Telephone Encounter (Signed)
No previous GI in system. Seen in ER with dx gastritis. Approved.

## 2017-04-15 ENCOUNTER — Encounter: Payer: Self-pay | Admitting: Internal Medicine

## 2017-04-15 NOTE — Telephone Encounter (Signed)
OV made and letter mailed to GF address

## 2017-05-05 ENCOUNTER — Encounter (HOSPITAL_COMMUNITY): Payer: Self-pay | Admitting: Emergency Medicine

## 2017-05-05 ENCOUNTER — Emergency Department (HOSPITAL_COMMUNITY): Payer: Medicaid Other

## 2017-05-05 ENCOUNTER — Emergency Department (HOSPITAL_COMMUNITY)
Admission: EM | Admit: 2017-05-05 | Discharge: 2017-05-05 | Disposition: A | Discharge status: Home / Self Care | Payer: Medicaid Other | Attending: Emergency Medicine | Admitting: Emergency Medicine

## 2017-05-05 ENCOUNTER — Other Ambulatory Visit: Payer: Self-pay

## 2017-05-05 DIAGNOSIS — R1011 Right upper quadrant pain: Secondary | ICD-10-CM | POA: Insufficient documentation

## 2017-05-05 DIAGNOSIS — F17210 Nicotine dependence, cigarettes, uncomplicated: Secondary | ICD-10-CM | POA: Insufficient documentation

## 2017-05-05 DIAGNOSIS — R112 Nausea with vomiting, unspecified: Secondary | ICD-10-CM | POA: Diagnosis present

## 2017-05-05 DIAGNOSIS — Z79899 Other long term (current) drug therapy: Secondary | ICD-10-CM | POA: Diagnosis not present

## 2017-05-05 DIAGNOSIS — Z91040 Latex allergy status: Secondary | ICD-10-CM | POA: Diagnosis not present

## 2017-05-05 DIAGNOSIS — R109 Unspecified abdominal pain: Secondary | ICD-10-CM

## 2017-05-05 LAB — CBC
HEMATOCRIT: 46 % (ref 39.0–52.0)
Hemoglobin: 15.6 g/dL (ref 13.0–17.0)
MCH: 29.8 pg (ref 26.0–34.0)
MCHC: 33.9 g/dL (ref 30.0–36.0)
MCV: 87.8 fL (ref 78.0–100.0)
PLATELETS: 215 10*3/uL (ref 150–400)
RBC: 5.24 MIL/uL (ref 4.22–5.81)
RDW: 12.1 % (ref 11.5–15.5)
WBC: 17.2 10*3/uL — AB (ref 4.0–10.5)

## 2017-05-05 LAB — DIFFERENTIAL
Basophils Absolute: 0 10*3/uL (ref 0.0–0.1)
Basophils Relative: 0 %
EOS PCT: 1 %
Eosinophils Absolute: 0.1 10*3/uL (ref 0.0–0.7)
LYMPHS ABS: 1.7 10*3/uL (ref 0.7–4.0)
LYMPHS PCT: 11 %
MONO ABS: 1.4 10*3/uL — AB (ref 0.1–1.0)
MONOS PCT: 8 %
NEUTROS ABS: 13.2 10*3/uL — AB (ref 1.7–7.7)
Neutrophils Relative %: 80 %

## 2017-05-05 LAB — COMPREHENSIVE METABOLIC PANEL
ALT: 9 U/L — AB (ref 17–63)
AST: 18 U/L (ref 15–41)
Albumin: 4.6 g/dL (ref 3.5–5.0)
Alkaline Phosphatase: 65 U/L (ref 38–126)
Anion gap: 11 (ref 5–15)
BUN: 9 mg/dL (ref 6–20)
CHLORIDE: 101 mmol/L (ref 101–111)
CO2: 26 mmol/L (ref 22–32)
CREATININE: 0.93 mg/dL (ref 0.61–1.24)
Calcium: 9.5 mg/dL (ref 8.9–10.3)
GFR calc non Af Amer: >60 mL/min (ref >60)
Glucose, Bld: 94 mg/dL (ref 65–99)
Potassium: 4 mmol/L (ref 3.5–5.1)
SODIUM: 138 mmol/L (ref 135–145)
Total Bilirubin: 0.8 mg/dL (ref 0.3–1.2)
Total Protein: 8 g/dL (ref 6.5–8.1)

## 2017-05-05 LAB — URINALYSIS, ROUTINE W REFLEX MICROSCOPIC
Bilirubin Urine: NEGATIVE
GLUCOSE, UA: NEGATIVE mg/dL
HGB URINE DIPSTICK: NEGATIVE
Ketones, ur: 5 mg/dL — AB
LEUKOCYTES UA: NEGATIVE
Nitrite: NEGATIVE
PH: 7 (ref 5.0–8.0)
PROTEIN: NEGATIVE mg/dL
SPECIFIC GRAVITY, URINE: 1.042 — AB (ref 1.005–1.030)

## 2017-05-05 LAB — LIPASE, BLOOD: LIPASE: 17 U/L (ref 11–51)

## 2017-05-05 MED ORDER — PANTOPRAZOLE SODIUM 40 MG IV SOLR
40.0000 mg | Freq: Once | INTRAVENOUS | Status: AC
  Administered 2017-05-05: 40 mg via INTRAVENOUS
  Filled 2017-05-05: qty 40

## 2017-05-05 MED ORDER — FAMOTIDINE 20 MG PO TABS: 20.0000 mg | ORAL_TABLET | Freq: Two times a day (BID) | ORAL | 0 refills | Status: DC

## 2017-05-05 MED ORDER — FAMOTIDINE IN NACL 20-0.9 MG/50ML-% IV SOLN
20.0000 mg | Freq: Once | INTRAVENOUS | Status: AC
  Administered 2017-05-05: 20 mg via INTRAVENOUS
  Filled 2017-05-05: qty 50

## 2017-05-05 MED ORDER — ONDANSETRON HCL 4 MG/2ML IJ SOLN
4.0000 mg | INTRAMUSCULAR | Status: DC | PRN
  Administered 2017-05-05: 4 mg via INTRAVENOUS
  Filled 2017-05-05: qty 2

## 2017-05-05 MED ORDER — FENTANYL CITRATE (PF) 100 MCG/2ML IJ SOLN
50.0000 ug | INTRAMUSCULAR | Status: DC | PRN
  Administered 2017-05-05: 50 ug via INTRAVENOUS
  Filled 2017-05-05: qty 2

## 2017-05-05 MED ORDER — IOPAMIDOL (ISOVUE-300) INJECTION 61%
100.0000 mL | Freq: Once | INTRAVENOUS | Status: AC | PRN
  Administered 2017-05-05: 100 mL via INTRAVENOUS

## 2017-05-05 NOTE — ED Provider Notes (Signed)
[facility] EMERGENCY DEPARTMENT Provider Note   CSN: [id number] Arrival date & time: 05/05/17  1823     History   Chief Complaint Chief Complaint  Patient presents with  . Abdominal Pain    HPI James Meadows is a 27 y.o. male.  HPI  Pt was seen at 1955.  Per pt, c/o gradual onset and persistence of constant RUQ abd "pain" for the past 1 months, worse over the past 4 days. Has been associated with several episodes of N/V.  Describes the abd pain as "sharp" and "stabbing." Pt was evaluated in the ED last month for these symptoms and was told "it was from me drinking alcohol." States he has not drank etoh since last month, but the pain continues.  Denies diarrhea, no fevers, no back pain, no rash, no CP/SOB, no black or blood in stools or emesis.      History reviewed. No pertinent past medical history.  Patient Active Problem List   Diagnosis Date Noted  . Suicidal ideation   . Overdose   . Major depressive disorder, recurrent severe without psychotic features (HCC) 04/29/2015  . PTSD (post-traumatic stress disorder) 04/29/2015  . Opioid dependence (HCC) 04/29/2015  . Marijuana abuse 08/22/2012  . ADHD (attention deficit hyperactivity disorder) 08/22/2012  . Episodic mood disorder (HCC) 08/22/2012    Past Surgical History:  Procedure Laterality Date  . FINGER SURGERY Right    pinky       Home Medications    Prior to Admission medications   Medication Sig Start Date End Date Taking? Authorizing Provider  ondansetron (ZOFRAN ODT) 4 MG disintegrating tablet 4mg  ODT q4 hours prn nausea/vomit 04/11/17   Bethann Berkshire, MD  pantoprazole (PROTONIX) 20 MG tablet Take 1 tablet (20 mg total) by mouth daily. 04/11/17   Bethann Berkshire, MD  traMADol (ULTRAM) 50 MG tablet Take 1 tablet (50 mg total) by mouth every 6 (six) hours as needed. 04/11/17   Bethann Berkshire, MD    Family History History reviewed. No pertinent family history.  Social History Social History    Tobacco Use  . Smoking status: Current Every Day Smoker    Packs/day: 0.02    Types: Cigarettes  . Smokeless tobacco: Never Used  Substance Use Topics  . Alcohol use: Yes    Comment: occ  . Drug use: Yes    Types: Marijuana     Allergies   Oxycodone; Ibuprofen; and Latex   Review of Systems Review of Systems ROS: Statement: All systems negative except as marked or noted in the HPI; Constitutional: Negative for fever and chills. ; ; Eyes: Negative for eye pain, redness and discharge. ; ; ENMT: Negative for ear pain, hoarseness, nasal congestion, sinus pressure and sore throat. ; ; Cardiovascular: Negative for chest pain, palpitations, diaphoresis, dyspnea and peripheral edema. ; ; Respiratory: Negative for cough, wheezing and stridor. ; ; Gastrointestinal: +N/V, abd pain. Negative for diarrhea, blood in stool, hematemesis, jaundice and rectal bleeding. . ; ; Genitourinary: Negative for dysuria, flank pain and hematuria. ; ; Musculoskeletal: Negative for back pain and neck pain. Negative for swelling and trauma.; ; Skin: Negative for pruritus, rash, abrasions, blisters, bruising and skin lesion.; ; Neuro: Negative for headache, lightheadedness and neck stiffness. Negative for weakness, altered level of consciousness, altered mental status, extremity weakness, paresthesias, involuntary movement, seizure and syncope.       Physical Exam Updated Vital Signs BP 140/82 (BP Location: Right Arm)   Pulse 83   Temp 99.5 F (  37.5 C) (Oral)   Resp 15   Ht 5\' 9"  (1.753 m)   Wt 68 kg (150 lb)   SpO2 100%   BMI 22.15 kg/m   Physical Exam 2000: Physical examination:  Nursing notes reviewed; Vital signs and O2 SAT reviewed;  Constitutional: Well developed, Well nourished, Well hydrated, Uncomfortable appearing.; Head:  Normocephalic, atraumatic; Eyes: EOMI, PERRL, No scleral icterus; ENMT: Mouth and pharynx normal, Mucous membranes moist; Neck: Supple, Full range of motion, No  lymphadenopathy; Cardiovascular: Regular rate and rhythm, No gallop; Respiratory: Breath sounds clear & equal bilaterally, No wheezes.  Speaking full sentences with ease, Normal respiratory effort/excursion; Chest: Nontender, Movement normal; Abdomen: Soft, +diffuse tenderness to palp.  Nondistended, Normal bowel sounds; Genitourinary: No CVA tenderness; Extremities: Pulses normal, No tenderness, No edema, No calf edema or asymmetry.; Neuro: AA&Ox3, Major CN grossly intact.  Speech clear. No gross focal motor or sensory deficits in extremities.; Skin: Color normal, Warm, Dry.   ED Treatments / Results  Labs (all labs ordered are listed, but only abnormal results are displayed)   EKG  EKG Interpretation None       Radiology   Procedures Procedures (including critical care time)  Medications Ordered in ED Medications  famotidine (PEPCID) IVPB 20 mg premix (not administered)  pantoprazole (PROTONIX) injection 40 mg (not administered)  ondansetron (ZOFRAN) injection 4 mg (not administered)  fentaNYL (SUBLIMAZE) injection 50 mcg (not administered)     Initial Impression / Assessment and Plan / ED Course  I have reviewed the triage vital signs and the nursing notes.  Pertinent labs & imaging results that were available during my care of the patient were reviewed by me and considered in my medical decision making (see chart for details).  MDM Reviewed: previous chart, nursing note and vitals Reviewed previous: labs and x-ray Interpretation: labs, x-ray and CT scan   Results for orders placed or performed during the hospital encounter of 05/05/17  Lipase, blood  Result Value Ref Range   Lipase 17 11 - 51 U/L  Comprehensive metabolic panel  Result Value Ref Range   Sodium 138 135 - 145 mmol/L   Potassium 4.0 3.5 - 5.1 mmol/L   Chloride 101 101 - 111 mmol/L   CO2 26 22 - 32 mmol/L   Glucose, Bld 94 65 - 99 mg/dL   BUN 9 6 - 20 mg/dL   Creatinine, Ser 0.98 0.61 - 1.24 mg/dL    Calcium 9.5 8.9 - 11.9 mg/dL   Total Protein 8.0 6.5 - 8.1 g/dL   Albumin 4.6 3.5 - 5.0 g/dL   AST 18 15 - 41 U/L   ALT 9 (L) 17 - 63 U/L   Alkaline Phosphatase 65 38 - 126 U/L   Total Bilirubin 0.8 0.3 - 1.2 mg/dL   GFR calc non Af Amer >60 >60 mL/min   GFR calc Af Amer >60 >60 mL/min   Anion gap 11 5 - 15  CBC  Result Value Ref Range   WBC 17.2 (H) 4.0 - 10.5 K/uL   RBC 5.24 4.22 - 5.81 MIL/uL   Hemoglobin 15.6 13.0 - 17.0 g/dL   HCT 14.7 82.9 - 56.2 %   MCV 87.8 78.0 - 100.0 fL   MCH 29.8 26.0 - 34.0 pg   MCHC 33.9 30.0 - 36.0 g/dL   RDW 13.0 86.5 - 78.4 %   Platelets 215 150 - 400 K/uL  Urinalysis, Routine w reflex microscopic  Result Value Ref Range   Color, Urine YELLOW YELLOW  APPearance CLEAR CLEAR   Specific Gravity, Urine 1.042 (H) 1.005 - 1.030   pH 7.0 5.0 - 8.0   Glucose, UA NEGATIVE NEGATIVE mg/dL   Hgb urine dipstick NEGATIVE NEGATIVE   Bilirubin Urine NEGATIVE NEGATIVE   Ketones, ur 5 (A) NEGATIVE mg/dL   Protein, ur NEGATIVE NEGATIVE mg/dL   Nitrite NEGATIVE NEGATIVE   Leukocytes, UA NEGATIVE NEGATIVE  Differential  Result Value Ref Range   Neutrophils Relative % 80 %   Neutro Abs 13.2 (H) 1.7 - 7.7 K/uL   Lymphocytes Relative 11 %   Lymphs Abs 1.7 0.7 - 4.0 K/uL   Monocytes Relative 8 %   Monocytes Absolute 1.4 (H) 0.1 - 1.0 K/uL   Eosinophils Relative 1 %   Eosinophils Absolute 0.1 0.0 - 0.7 K/uL   Basophils Relative 0 %   Basophils Absolute 0.0 0.0 - 0.1 K/uL   Dg Chest 2 View Result Date: 05/05/2017 CLINICAL DATA:  Lower abdominal pressure and pain for 1 month. EXAM: CHEST  2 VIEW COMPARISON:  April 11, 2017 FINDINGS: The heart size and mediastinal contours are within normal limits. There is no focal infiltrate, pulmonary edema, or pleural effusion. There is scoliosis of spine. IMPRESSION: No active cardiopulmonary disease. Electronically Signed   By: Sherian ReinWei-Chen  Lin M.D.   On: 05/05/2017 21:03   Ct Abdomen Pelvis W Contrast Result Date:  05/05/2017 CLINICAL DATA:  Right upper quadrant abdominal pain for 1 month. EXAM: CT ABDOMEN AND PELVIS WITH CONTRAST TECHNIQUE: Multidetector CT imaging of the abdomen and pelvis was performed using the standard protocol following bolus administration of intravenous contrast. CONTRAST:  100mL ISOVUE-300 IOPAMIDOL (ISOVUE-300) INJECTION 61% COMPARISON:  CT scan 12/14/2016 FINDINGS: Lower chest: The lung bases are clear of acute process. No pleural effusion or pulmonary lesions. The heart is normal in size. No pericardial effusion. The distal esophagus and aorta are unremarkable. Hepatobiliary: No focal hepatic lesions or intrahepatic biliary dilatation. The gallbladder is normal. No common bile duct dilatation. Pancreas: No mass, inflammation or ductal dilatation. Spleen: Normal size.  No focal lesions. Adrenals/Urinary Tract: The adrenal glands and kidneys are unremarkable. No renal, ureteral or bladder calculi or mass. Stomach/Bowel: The stomach, duodenum, small bowel and colon are grossly normal without oral contrast. No inflammatory changes, mass lesions or obstructive findings. The terminal ileum and appendix are normal. Vascular/Lymphatic: The aorta is normal in caliber. No dissection. The branch vessels are patent. The major venous structures are patent. No mesenteric or retroperitoneal mass or adenopathy. Small scattered lymph nodes are noted. Reproductive: The prostate gland and seminal vesicles are unremarkable. Other: No pelvic mass or adenopathy. No free pelvic fluid collections. No inguinal mass or adenopathy. No abdominal wall hernia or subcutaneous lesions. Musculoskeletal: No significant bony findings. IMPRESSION: No acute abdominal/pelvic findings, mass lesions or adenopathy. Electronically Signed   By: Rudie MeyerP.  Gallerani M.D.   On: 05/05/2017 20:57    2210:  Workup reassuring. Pt has tol PO well while in the ED without N/V.  No stooling while in the ED.  Abd benign, VSS. Feels better and wants to go  home now. Tx symptomatically, f/u GI MD. Dx and testing d/w pt and family.  Questions answered.  Verb understanding, agreeable to d/c home with outpt f/u.    Final Clinical Impressions(s) / ED Diagnoses   Final diagnoses:  None    ED Discharge Orders    None       Samuel JesterMcManus, Wing Gfeller, DO 05/07/17 1507

## 2017-05-05 NOTE — ED Triage Notes (Signed)
Patient c/o right upper abd pain that has been intermittent x1 month. Patient reports nausea and vomiting. Denies any fevers, diarrhea, or urinary symptoms. Per patient was seen in December for same pain and was told he "injuried lining in stomach due to alcohol."

## 2017-05-05 NOTE — Discharge Instructions (Signed)
Eat a bland diet, avoiding greasy, fatty, fried foods, as well as spicy and acidic foods or beverages.  Avoid eating within 2 to 3 hours before going to bed or laying down.  Also avoid teas, colas, coffee, chocolate, pepermint and spearment. Take the prescription as directed. Take over the counter maalox/mylanta, as directed on packaging, as needed for discomfort.  Take the prescription as directed.  Call your regular medical doctor tomorrow to schedule a follow up appointment in the next 3 days. Call the GI doctor tomorrow to schedule a follow up appointment this week.  Return to the Emergency Department immediately if worsening.

## 2017-06-11 ENCOUNTER — Telehealth: Payer: Self-pay | Admitting: Nurse Practitioner

## 2017-06-11 ENCOUNTER — Encounter: Payer: Self-pay | Admitting: Nurse Practitioner

## 2017-06-11 ENCOUNTER — Ambulatory Visit: Payer: Self-pay | Admitting: Nurse Practitioner

## 2017-06-11 NOTE — Telephone Encounter (Signed)
Noted  

## 2017-06-11 NOTE — Telephone Encounter (Signed)
PATIENT WAS A NO SHOW AND LETTER SENT  °

## 2017-06-30 ENCOUNTER — Emergency Department (HOSPITAL_COMMUNITY)
Admission: EM | Admit: 2017-06-30 | Discharge: 2017-06-30 | Disposition: A | Discharge status: Home / Self Care | Payer: Medicaid Other | Attending: Emergency Medicine | Admitting: Emergency Medicine

## 2017-06-30 ENCOUNTER — Encounter (HOSPITAL_COMMUNITY): Payer: Self-pay | Admitting: *Deleted

## 2017-06-30 DIAGNOSIS — F17210 Nicotine dependence, cigarettes, uncomplicated: Secondary | ICD-10-CM | POA: Diagnosis not present

## 2017-06-30 DIAGNOSIS — Z202 Contact with and (suspected) exposure to infections with a predominantly sexual mode of transmission: Secondary | ICD-10-CM | POA: Diagnosis present

## 2017-06-30 DIAGNOSIS — Z91040 Latex allergy status: Secondary | ICD-10-CM | POA: Insufficient documentation

## 2017-06-30 MED ORDER — LIDOCAINE HCL (PF) 1 % IJ SOLN
INTRAMUSCULAR | Status: AC
  Administered 2017-06-30: 2 mL
  Filled 2017-06-30: qty 2

## 2017-06-30 MED ORDER — AZITHROMYCIN 250 MG PO TABS
1000.0000 mg | ORAL_TABLET | Freq: Once | ORAL | Status: AC
  Administered 2017-06-30: 1000 mg via ORAL
  Filled 2017-06-30: qty 4

## 2017-06-30 MED ORDER — CEFTRIAXONE SODIUM 250 MG IJ SOLR
250.0000 mg | Freq: Once | INTRAMUSCULAR | Status: AC
  Administered 2017-06-30: 250 mg via INTRAMUSCULAR
  Filled 2017-06-30: qty 250

## 2017-06-30 NOTE — ED Triage Notes (Signed)
Pt was informed by girlfriend to be checked out.

## 2017-06-30 NOTE — Discharge Instructions (Signed)
Please avoid sexual intercourse with people who have known sexually transmitted diseases.  Wear a condom to prevent the transmission of sexually transmitted diseases

## 2017-06-30 NOTE — ED Provider Notes (Signed)
[facility] EMERGENCY DEPARTMENT Provider Note   CSN: [id number] Arrival date & time: 06/30/17  2038     History   Chief Complaint Chief Complaint  Patient presents with  . Exposure to STD    HPI James Meadows is a 27 y.o. male.  HPI  27 year old male who states that he was told by his "home boy" that he was possibly exposed to an STD.  He states he was "flipping bitches" and his friend developed drainage and discharge and was finally diagnosed with chlamydia.  The patient wants to be tested for STDs.  He has no signs or symptoms of STDs including no itching pain discharge or dysuria  History reviewed. No pertinent past medical history.  Patient Active Problem List   Diagnosis Date Noted  . Suicidal ideation   . Overdose   . Major depressive disorder, recurrent severe without psychotic features (HCC) 04/29/2015  . PTSD (post-traumatic stress disorder) 04/29/2015  . Opioid dependence (HCC) 04/29/2015  . Marijuana abuse 08/22/2012  . ADHD (attention deficit hyperactivity disorder) 08/22/2012  . Episodic mood disorder (HCC) 08/22/2012    Past Surgical History:  Procedure Laterality Date  . FINGER SURGERY Right    pinky       Home Medications    Prior to Admission medications   Medication Sig Start Date End Date Taking? Authorizing Provider  famotidine (PEPCID) 20 MG tablet Take 1 tablet (20 mg total) by mouth 2 (two) times daily. 05/05/17   Samuel Jester, DO    Family History History reviewed. No pertinent family history.  Social History Social History   Tobacco Use  . Smoking status: Current Every Day Smoker    Packs/day: 0.02    Types: Cigarettes  . Smokeless tobacco: Never Used  Substance Use Topics  . Alcohol use: Yes    Comment: occ  . Drug use: Yes    Types: Marijuana     Allergies   Oxycodone; Ibuprofen; and Latex   Review of Systems Review of Systems   Physical Exam Updated Vital Signs BP 114/66 (BP Location: Left Arm)    Pulse 87   Temp 99.2 F (37.3 C) (Oral)   Resp 16   Ht 5\' 5"  (1.651 m)   Wt 86.2 kg (190 lb)   SpO2 98%   BMI 31.62 kg/m   Physical Exam  Constitutional: He appears well-developed and well-nourished. No distress.  HENT:  Head: Normocephalic and atraumatic.  Pulmonary/Chest: Effort normal.  Abdominal: Soft. There is no tenderness.  Genitourinary:  Genitourinary Comments: Normal-appearing penis scrotum and testicles, no drainage from the penis  Musculoskeletal: He exhibits no edema.  Neurological: He is alert.  Skin: No rash noted. He is not diaphoretic.     ED Treatments / Results  Labs (all labs ordered are listed, but only abnormal results are displayed) Labs Reviewed  GC/CHLAMYDIA PROBE AMP ([place]) NOT AT [facility]    Radiology No results found.  Procedures Procedures (including critical care time)  Medications Ordered in ED Medications  cefTRIAXone (ROCEPHIN) injection 250 mg (not administered)  azithromycin (ZITHROMAX) tablet 1,000 mg (not administered)     Initial Impression / Assessment and Plan / ED Course  I have reviewed the triage vital signs and the nursing notes.  Pertinent labs & imaging results that were available during my care of the patient were reviewed by me and considered in my medical decision making (see chart for details).     STD exposure, swab, sent for STD testing, treated with  Rocephin and Zithromax, stable for discharge, counseled patient on safe sex practices  Final Clinical Impressions(s) / ED Diagnoses   Final diagnoses:  STD exposure      Eber HongMiller, Shirlene Andaya, MD 06/30/17 2136

## 2017-06-30 NOTE — ED Triage Notes (Signed)
Pt denies any symptoms  

## 2017-07-02 LAB — GC/CHLAMYDIA PROBE AMP (~~LOC~~) NOT AT ARMC
CHLAMYDIA, DNA PROBE: POSITIVE — AB
NEISSERIA GONORRHEA: POSITIVE — AB

## 2017-07-22 DIAGNOSIS — S62309A Unspecified fracture of unspecified metacarpal bone, initial encounter for closed fracture: Secondary | ICD-10-CM

## 2017-07-22 HISTORY — DX: Unspecified fracture of unspecified metacarpal bone, initial encounter for closed fracture: S62.309A

## 2017-08-01 ENCOUNTER — Emergency Department (HOSPITAL_COMMUNITY): Payer: Medicaid Other

## 2017-08-01 ENCOUNTER — Encounter (HOSPITAL_COMMUNITY): Payer: Self-pay | Admitting: *Deleted

## 2017-08-01 ENCOUNTER — Other Ambulatory Visit: Payer: Self-pay

## 2017-08-01 ENCOUNTER — Emergency Department (HOSPITAL_COMMUNITY)
Admission: EM | Admit: 2017-08-01 | Discharge: 2017-08-01 | Disposition: A | Discharge status: Home / Self Care | Payer: Medicaid Other | Attending: Emergency Medicine | Admitting: Emergency Medicine

## 2017-08-01 DIAGNOSIS — S62356A Nondisplaced fracture of shaft of fifth metacarpal bone, right hand, initial encounter for closed fracture: Secondary | ICD-10-CM | POA: Diagnosis not present

## 2017-08-01 DIAGNOSIS — Y939 Activity, unspecified: Secondary | ICD-10-CM | POA: Diagnosis not present

## 2017-08-01 DIAGNOSIS — Z91040 Latex allergy status: Secondary | ICD-10-CM | POA: Insufficient documentation

## 2017-08-01 DIAGNOSIS — S6991XA Unspecified injury of right wrist, hand and finger(s), initial encounter: Secondary | ICD-10-CM | POA: Diagnosis present

## 2017-08-01 DIAGNOSIS — Z79899 Other long term (current) drug therapy: Secondary | ICD-10-CM | POA: Diagnosis not present

## 2017-08-01 DIAGNOSIS — Y999 Unspecified external cause status: Secondary | ICD-10-CM | POA: Diagnosis not present

## 2017-08-01 DIAGNOSIS — F17210 Nicotine dependence, cigarettes, uncomplicated: Secondary | ICD-10-CM | POA: Diagnosis not present

## 2017-08-01 DIAGNOSIS — W2209XA Striking against other stationary object, initial encounter: Secondary | ICD-10-CM | POA: Diagnosis not present

## 2017-08-01 DIAGNOSIS — Y929 Unspecified place or not applicable: Secondary | ICD-10-CM | POA: Insufficient documentation

## 2017-08-01 MED ORDER — HYDROCODONE-ACETAMINOPHEN 5-325 MG PO TABS
1.0000 | ORAL_TABLET | Freq: Once | ORAL | Status: AC
  Administered 2017-08-01: 1 via ORAL
  Filled 2017-08-01: qty 1

## 2017-08-01 MED ORDER — HYDROCODONE-ACETAMINOPHEN 5-325 MG PO TABS: 1.0000 | ORAL_TABLET | Freq: Four times a day (QID) | ORAL | 0 refills | Status: DC | PRN

## 2017-08-01 NOTE — ED Provider Notes (Signed)
[facility] EMERGENCY DEPARTMENT Provider Note   CSN: [id number] Arrival date & time: 08/01/17  2029     History   Chief Complaint Chief Complaint  Patient presents with  . Hand Injury    HPI James Meadows is a 27 y.o. male.  Patient states he struck his hand on a window frame while playing with his child. The window shattered. Several small, superficial lacerations to the fingers of the right hand, with swelling and tenderness over the mid-shaft 5th metacarpal. Patient reports allergy to oxycodone, but is able to take hydrocodone.  The history is provided by the patient. No language interpreter was used.  Hand Injury   The incident occurred 3 to 5 hours ago. The incident occurred at home. The injury mechanism was a direct blow. The pain is present in the right hand. The quality of the pain is described as throbbing. The pain is moderate. The pain has been fluctuating since the incident. He reports no foreign bodies present. The symptoms are aggravated by movement.    History reviewed. No pertinent past medical history.  Patient Active Problem List   Diagnosis Date Noted  . Suicidal ideation   . Overdose   . Major depressive disorder, recurrent severe without psychotic features (HCC) 04/29/2015  . PTSD (post-traumatic stress disorder) 04/29/2015  . Opioid dependence (HCC) 04/29/2015  . Marijuana abuse 08/22/2012  . ADHD (attention deficit hyperactivity disorder) 08/22/2012  . Episodic mood disorder (HCC) 08/22/2012    Past Surgical History:  Procedure Laterality Date  . FINGER SURGERY Right    pinky        Home Medications    Prior to Admission medications   Medication Sig Start Date End Date Taking? Authorizing Provider  famotidine (PEPCID) 20 MG tablet Take 1 tablet (20 mg total) by mouth 2 (two) times daily. 05/05/17   Samuel Jester, DO    Family History No family history on file.  Social History Social History   Tobacco Use  . Smoking status:  Current Every Day Smoker    Packs/day: 0.02    Types: Cigarettes  . Smokeless tobacco: Never Used  Substance Use Topics  . Alcohol use: Yes    Comment: occ  . Drug use: Yes    Types: Marijuana     Allergies   Oxycodone; Ibuprofen; and Latex   Review of Systems Review of Systems  Musculoskeletal: Positive for arthralgias.  All other systems reviewed and are negative.    Physical Exam Updated Vital Signs BP 118/85 (BP Location: Right Arm)   Pulse (!) 105   Temp 99.3 F (37.4 C) (Oral)   Resp 18   Wt 72.6 kg (160 lb)   SpO2 100%   BMI 26.63 kg/m   Physical Exam  Constitutional: He is oriented to person, place, and time. He appears well-developed and well-nourished.  HENT:  Head: Atraumatic.  Eyes: Conjunctivae are normal.  Neck: Neck supple.  Cardiovascular: Normal rate and regular rhythm.  Pulmonary/Chest: Effort normal and breath sounds normal.  Abdominal: Soft. Bowel sounds are normal.  Musculoskeletal: He exhibits edema and tenderness.       Right hand: He exhibits decreased range of motion, tenderness and swelling.  Superficial laceration to medial aspect of hand, along with several fingers. Tenderness and swelling overlying mid-shaft metacarpal.  Neurological: He is alert and oriented to person, place, and time.  Skin: Skin is warm and dry.  Psychiatric: He has a normal mood and affect.  Nursing note and vitals reviewed.  ED Treatments / Results  Labs (all labs ordered are listed, but only abnormal results are displayed) Labs Reviewed - No data to display  EKG None  Radiology Dg Hand Complete Right  Result Date: 08/01/2017 CLINICAL DATA:  Struck hand on glass. EXAM: RIGHT HAND - COMPLETE 3+ VIEW COMPARISON:  None. FINDINGS: Acute transverse fracture through fifth metacarpal diaphysis with mild palmar angulation distal bony fragments. No dislocation. No destructive bony lesions. Tiny radiopaque foreign bodies in the medial hand soft tissues without  subcutaneous gas. IMPRESSION: Acute potentially open displaced fifth metacarpus fracture. Tiny probable glass fragments medial hand. Electronically Signed   By: Awilda Metroourtnay  Bloomer M.D.   On: 08/01/2017 21:01    Procedures .Splint Application Date/Time: 08/01/2017 11:40 PM Performed by: Shea StakesMosher, Jennifer, RN Authorized by: Felicie MornSmith, Jahlisa Rossitto, NP   Consent:    Consent obtained:  Verbal   Consent given by:  Patient   Risks discussed:  Pain and swelling   Alternatives discussed:  No treatment Pre-procedure details:    Sensation:  Normal Procedure details:    Laterality:  Right   Location:  Hand   Hand:  R hand   Strapping: no     Splint type:  Ulnar gutter   Supplies:  Sling Post-procedure details:    Pain:  Unchanged   Sensation:  Normal   Patient tolerance of procedure:  Tolerated well, no immediate complications   (including critical care time)  Medications Ordered in ED Medications  HYDROcodone-acetaminophen (NORCO/VICODIN) 5-325 MG per tablet 1 tablet (1 tablet Oral Given 08/01/17 2241)     Initial Impression / Assessment and Plan / ED Course  I have reviewed the triage vital signs and the nursing notes.  Pertinent labs & imaging results that were available during my care of the patient were reviewed by me and considered in my medical decision making (see chart for details).     Patient X-Ray positive for 5th metacarpal fracture of the right hand.  Pt advised to follow up with hand Merlyn Lot(Kuzma). Patient placed into ulnar gutter splint and sling while in ED. Care instructions provided. Patient will be discharged home & is agreeable with above plan. Returns precautions discussed. Pt appears safe for discharge.  Final Clinical Impressions(s) / ED Diagnoses   Final diagnoses:  Closed nondisplaced fracture of shaft of fifth metacarpal bone of right hand, initial encounter    ED Discharge Orders        Ordered    HYDROcodone-acetaminophen (NORCO/VICODIN) 5-325 MG tablet  Every 6  hours PRN     08/01/17 2252       Felicie MornSmith, Eldredge Veldhuizen, NP 08/01/17 2343    Maia PlanLong, Joshua G, MD 08/02/17 1014

## 2017-08-01 NOTE — ED Triage Notes (Signed)
Pt c/o playing with his son when he hit his right hand against some glass. C/o pain and swelling to right  Hand.

## 2017-08-05 ENCOUNTER — Other Ambulatory Visit: Payer: Self-pay

## 2017-08-05 ENCOUNTER — Other Ambulatory Visit: Payer: Self-pay | Admitting: Orthopedic Surgery

## 2017-08-05 ENCOUNTER — Encounter (HOSPITAL_BASED_OUTPATIENT_CLINIC_OR_DEPARTMENT_OTHER): Payer: Self-pay | Admitting: *Deleted

## 2017-08-08 ENCOUNTER — Ambulatory Visit (HOSPITAL_BASED_OUTPATIENT_CLINIC_OR_DEPARTMENT_OTHER): Payer: Medicaid Other | Admitting: Anesthesiology

## 2017-08-08 ENCOUNTER — Encounter (HOSPITAL_BASED_OUTPATIENT_CLINIC_OR_DEPARTMENT_OTHER)
Admission: RE | Disposition: A | Discharge status: Home / Self Care | Payer: Self-pay | Source: Ambulatory Visit | Attending: Orthopedic Surgery

## 2017-08-08 ENCOUNTER — Encounter (HOSPITAL_BASED_OUTPATIENT_CLINIC_OR_DEPARTMENT_OTHER): Payer: Self-pay | Admitting: Anesthesiology

## 2017-08-08 ENCOUNTER — Ambulatory Visit (HOSPITAL_BASED_OUTPATIENT_CLINIC_OR_DEPARTMENT_OTHER)
Admission: RE | Admit: 2017-08-08 | Discharge: 2017-08-08 | Disposition: A | Discharge status: Home / Self Care | Payer: Medicaid Other | Source: Ambulatory Visit | Attending: Orthopedic Surgery | Admitting: Orthopedic Surgery

## 2017-08-08 DIAGNOSIS — Z885 Allergy status to narcotic agent status: Secondary | ICD-10-CM | POA: Diagnosis not present

## 2017-08-08 DIAGNOSIS — Z91040 Latex allergy status: Secondary | ICD-10-CM | POA: Insufficient documentation

## 2017-08-08 DIAGNOSIS — F17210 Nicotine dependence, cigarettes, uncomplicated: Secondary | ICD-10-CM | POA: Insufficient documentation

## 2017-08-08 DIAGNOSIS — W228XXA Striking against or struck by other objects, initial encounter: Secondary | ICD-10-CM | POA: Insufficient documentation

## 2017-08-08 DIAGNOSIS — S62306A Unspecified fracture of fifth metacarpal bone, right hand, initial encounter for closed fracture: Secondary | ICD-10-CM | POA: Insufficient documentation

## 2017-08-08 HISTORY — DX: Unspecified fracture of unspecified metacarpal bone, initial encounter for closed fracture: S62.309A

## 2017-08-08 HISTORY — PX: OPEN REDUCTION INTERNAL FIXATION (ORIF) METACARPAL: SHX6234

## 2017-08-08 SURGERY — OPEN REDUCTION INTERNAL FIXATION (ORIF) METACARPAL
Anesthesia: Regional | Site: Hand | Laterality: Right

## 2017-08-08 MED ORDER — MIDAZOLAM HCL 2 MG/2ML IJ SOLN
INTRAMUSCULAR | Status: AC
  Filled 2017-08-08: qty 2

## 2017-08-08 MED ORDER — PROPOFOL 10 MG/ML IV BOLUS
INTRAVENOUS | Status: DC | PRN
  Administered 2017-08-08: 150 mg via INTRAVENOUS

## 2017-08-08 MED ORDER — LACTATED RINGERS IV SOLN
INTRAVENOUS | Status: DC
  Administered 2017-08-08 (×2): via INTRAVENOUS

## 2017-08-08 MED ORDER — SCOPOLAMINE 1 MG/3DAYS TD PT72: 1.0000 | MEDICATED_PATCH | Freq: Once | TRANSDERMAL | Status: DC | PRN

## 2017-08-08 MED ORDER — CEFAZOLIN SODIUM-DEXTROSE 2-4 GM/100ML-% IV SOLN
INTRAVENOUS | Status: AC
Start: 2017-08-08 — End: ?
  Filled 2017-08-08: qty 100

## 2017-08-08 MED ORDER — FENTANYL CITRATE (PF) 100 MCG/2ML IJ SOLN
INTRAMUSCULAR | Status: AC
  Filled 2017-08-08: qty 2

## 2017-08-08 MED ORDER — MIDAZOLAM HCL 2 MG/2ML IJ SOLN
1.0000 mg | INTRAMUSCULAR | Status: DC | PRN
  Administered 2017-08-08: 2 mg via INTRAVENOUS

## 2017-08-08 MED ORDER — FENTANYL CITRATE (PF) 100 MCG/2ML IJ SOLN
50.0000 ug | INTRAMUSCULAR | Status: DC | PRN
  Administered 2017-08-08: 100 ug via INTRAVENOUS

## 2017-08-08 MED ORDER — ONDANSETRON HCL 4 MG/2ML IJ SOLN
INTRAMUSCULAR | Status: DC | PRN
  Administered 2017-08-08: 4 mg via INTRAVENOUS

## 2017-08-08 MED ORDER — HYDROCODONE-ACETAMINOPHEN 7.5-325 MG PO TABS: ORAL_TABLET | ORAL | 0 refills | Status: DC

## 2017-08-08 MED ORDER — CEFAZOLIN SODIUM-DEXTROSE 2-4 GM/100ML-% IV SOLN
2.0000 g | INTRAVENOUS | Status: AC
  Administered 2017-08-08: 2 g via INTRAVENOUS

## 2017-08-08 MED ORDER — ROPIVACAINE HCL 5 MG/ML IJ SOLN
INTRAMUSCULAR | Status: DC | PRN
  Administered 2017-08-08: 30 mL via PERINEURAL

## 2017-08-08 MED ORDER — DEXAMETHASONE SODIUM PHOSPHATE 4 MG/ML IJ SOLN
INTRAMUSCULAR | Status: DC | PRN
  Administered 2017-08-08: 10 mg via INTRAVENOUS

## 2017-08-08 MED ORDER — CHLORHEXIDINE GLUCONATE 4 % EX LIQD: 60.0000 mL | Freq: Once | CUTANEOUS | Status: DC

## 2017-08-08 SURGICAL SUPPLY — 58 items
BANDAGE ACE 3X5.8 VEL STRL LF (GAUZE/BANDAGES/DRESSINGS) ×3 IMPLANT
BIT DRILL 1.1 (BIT) ×1
BIT DRILL 1.1MM (BIT) ×1
BIT DRILL 60X20X1.1XQC TMX (BIT) ×1 IMPLANT
BIT DRL 60X20X1.1XQC TMX (BIT) ×1
BLADE SURG 15 STRL LF DISP TIS (BLADE) ×2 IMPLANT
BLADE SURG 15 STRL SS (BLADE) ×4
BNDG ESMARK 4X9 LF (GAUZE/BANDAGES/DRESSINGS) ×3 IMPLANT
BNDG GAUZE ELAST 4 BULKY (GAUZE/BANDAGES/DRESSINGS) ×3 IMPLANT
CHLORAPREP W/TINT 26ML (MISCELLANEOUS) ×3 IMPLANT
CORD BIPOLAR FORCEPS 12FT (ELECTRODE) ×3 IMPLANT
COVER BACK TABLE 60X90IN (DRAPES) ×3 IMPLANT
COVER MAYO STAND STRL (DRAPES) ×3 IMPLANT
CUFF TOURNIQUET SINGLE 18IN (TOURNIQUET CUFF) ×3 IMPLANT
DRAPE EXTREMITY T 121X128X90 (DRAPE) ×3 IMPLANT
DRAPE OEC MINIVIEW 54X84 (DRAPES) ×3 IMPLANT
DRAPE SURG 17X23 STRL (DRAPES) ×3 IMPLANT
GAUZE SPONGE 4X4 12PLY STRL (GAUZE/BANDAGES/DRESSINGS) ×3 IMPLANT
GAUZE XEROFORM 1X8 LF (GAUZE/BANDAGES/DRESSINGS) ×3 IMPLANT
GLOVE BIO SURGEON STRL SZ 6.5 (GLOVE) ×2 IMPLANT
GLOVE BIO SURGEON STRL SZ7.5 (GLOVE) ×3 IMPLANT
GLOVE BIO SURGEONS STRL SZ 6.5 (GLOVE) ×1
GLOVE BIOGEL PI IND STRL 7.0 (GLOVE) ×2 IMPLANT
GLOVE BIOGEL PI IND STRL 8 (GLOVE) ×2 IMPLANT
GLOVE BIOGEL PI IND STRL 8.5 (GLOVE) ×1 IMPLANT
GLOVE BIOGEL PI INDICATOR 7.0 (GLOVE) ×4
GLOVE BIOGEL PI INDICATOR 8 (GLOVE) ×4
GLOVE BIOGEL PI INDICATOR 8.5 (GLOVE) ×2
GLOVE SURG SS PI 8.0 STRL IVOR (GLOVE) ×3 IMPLANT
GOWN STRL REUS W/ TWL LRG LVL3 (GOWN DISPOSABLE) ×1 IMPLANT
GOWN STRL REUS W/TWL LRG LVL3 (GOWN DISPOSABLE) ×2
GOWN STRL REUS W/TWL XL LVL3 (GOWN DISPOSABLE) ×6 IMPLANT
NEEDLE HYPO 25X1 1.5 SAFETY (NEEDLE) IMPLANT
NS IRRIG 1000ML POUR BTL (IV SOLUTION) ×3 IMPLANT
PACK BASIN DAY SURGERY FS (CUSTOM PROCEDURE TRAY) ×3 IMPLANT
PAD CAST 4YDX4 CTTN HI CHSV (CAST SUPPLIES) ×1 IMPLANT
PADDING CAST COTTON 4X4 STRL (CAST SUPPLIES) ×2
PLATE STRAIGHT LOCK 1.5 (Plate) ×3 IMPLANT
SCREW NL 1.5X12 (Screw) ×6 IMPLANT
SCREW NL 1.5X13 (Screw) ×6 IMPLANT
SCREW NONIOC 1.5 14M (Screw) ×6 IMPLANT
SLEEVE SCD COMPRESS KNEE MED (MISCELLANEOUS) IMPLANT
SPLINT PLASTER CAST XFAST 3X15 (CAST SUPPLIES) IMPLANT
SPLINT PLASTER CAST XFAST 4X15 (CAST SUPPLIES) IMPLANT
SPLINT PLASTER XTRA FAST SET 4 (CAST SUPPLIES)
SPLINT PLASTER XTRA FASTSET 3X (CAST SUPPLIES)
STOCKINETTE 4X48 STRL (DRAPES) ×3 IMPLANT
SUT CHROMIC 4 0 PS 2 18 (SUTURE) ×3 IMPLANT
SUT ETHILON 3 0 PS 1 (SUTURE) IMPLANT
SUT ETHILON 4 0 PS 2 18 (SUTURE) ×3 IMPLANT
SUT MERSILENE 4 0 P 3 (SUTURE) IMPLANT
SUT VIC AB 3-0 PS1 18 (SUTURE)
SUT VIC AB 3-0 PS1 18XBRD (SUTURE) IMPLANT
SUT VICRYL 4-0 PS2 18IN ABS (SUTURE) ×3 IMPLANT
SYR BULB 3OZ (MISCELLANEOUS) ×3 IMPLANT
SYR CONTROL 10ML LL (SYRINGE) IMPLANT
TOWEL OR 17X24 6PK STRL BLUE (TOWEL DISPOSABLE) ×6 IMPLANT
UNDERPAD 30X30 (UNDERPADS AND DIAPERS) ×3 IMPLANT

## 2017-08-08 NOTE — Anesthesia Procedure Notes (Signed)
Procedure Name: LMA Insertion Date/Time: 08/08/2017 1:09 PM Performed by: Gar GibbonKeeton, Keilynn Marano S, CRNA Pre-anesthesia Checklist: Patient identified, Emergency Drugs available, Suction available and Patient being monitored Patient Re-evaluated:Patient Re-evaluated prior to induction Oxygen Delivery Method: Circle system utilized Preoxygenation: Pre-oxygenation with 100% oxygen Induction Type: IV induction Ventilation: Mask ventilation without difficulty LMA: LMA inserted LMA Size: 4.0 Number of attempts: 1 Airway Equipment and Method: Bite block Placement Confirmation: positive ETCO2 Tube secured with: Tape Dental Injury: Teeth and Oropharynx as per pre-operative assessment

## 2017-08-08 NOTE — Op Note (Signed)
[id number] 

## 2017-08-08 NOTE — Transfer of Care (Signed)
Immediate Anesthesia Transfer of Care Note  Patient: James Meadows  Procedure(s) Performed: OPEN REDUCTION INTERNAL FIXATION (ORIF) RIGHT SMALL METACARPAL (Right Hand)  Patient Location: PACU  Anesthesia Type:General  Level of Consciousness: awake, alert  and oriented  Airway & Oxygen Therapy: Patient Spontanous Breathing and Patient connected to face mask oxygen  Post-op Assessment: Report given to RN and Post -op Vital signs reviewed and stable  Post vital signs: Reviewed and stable  Last Vitals:  Vitals Value Taken Time  BP    Temp    Pulse 81 08/08/2017  2:01 PM  Resp 24 08/08/2017  2:01 PM  SpO2 100 % 08/08/2017  2:01 PM  Vitals shown include unvalidated device data.  Last Pain:  Vitals:   08/08/17 1159  TempSrc: Oral  PainSc:          Complications: No apparent anesthesia complications

## 2017-08-08 NOTE — Op Note (Signed)
I assisted Surgeon(s) and Role:    Betha Loa* Envi Eagleson, Kevin, MD - Primary on the Procedure(s): OPEN REDUCTION INTERNAL FIXATION (ORIF) RIGHT SMALL METACARPAL on 08/08/2017.  I provided assistance on this case as follows: approach, debridement of the fracture,reduction, stabilization and fixation of the fracture, closure of the wounds and application of the dressings and splints.  Electronically signed by: Nicki ReaperKUZMA,Marik Sedore R, MD Date: 08/08/2017 Time: 1:57 PM

## 2017-08-08 NOTE — H&P (Signed)
  James Meadows is an 27 y.o. male.   Chief Complaint: right hand fracture HPI: 27 yo male states he fell and hit right hand on window sash one week ago.  Seen in ED where XR revealed metacarpal fracture.  Splinted and followed up in office.  He wishes to proceed with operative fixation.    Allergies:  Allergies  Allergen Reactions  . Latex Itching  . Oxycodone Rash and Other (See Comments)    FEELS LIKE SKIN IS BURNING    Past Medical History:  Diagnosis Date  . Metacarpal bone fracture 07/2017   right small    Past Surgical History:  Procedure Laterality Date  . NO PAST SURGERIES      Family History: History reviewed. No pertinent family history.  Social History:   reports that he has been smoking cigarettes.  He has never used smokeless tobacco. He reports that he has current or past drug history. Drug: Marijuana. He reports that he does not drink alcohol.  Medications: No medications prior to admission.    No results found for this or any previous visit (from the past 48 hour(s)).  No results found.   A comprehensive review of systems was negative.  Height 5\' 9"  (1.753 m), weight 72.6 kg (160 lb).  General appearance: alert, cooperative and appears stated age Head: Normocephalic, without obvious abnormality, atraumatic Neck: supple, symmetrical, trachea midline Cardio: regular rate and rhythm Resp: clear to auscultation bilaterally Extremities: Intact sensation and capillary refill all digits.  +epl/fpl/io.  No wounds.  Pulses: 2+ and symmetric Skin: Skin color, texture, turgor normal. No rashes or lesions Neurologic: Grossly normal Incision/Wound: none  Assessment/Plan Right small finger metacarpal fracture.  Non operative and operative treatment options were discussed with the patient and patient wishes to proceed with operative treatment. Risks, benefits, and alternatives of surgery were discussed and the patient agrees with the plan of  care.   Diamone Whistler R 08/08/2017, 8:30 AM

## 2017-08-08 NOTE — Anesthesia Preprocedure Evaluation (Signed)
Anesthesia Evaluation  Patient identified by MRN, date of birth, ID band Patient awake    Reviewed: Allergy & Precautions, NPO status , Patient's Chart, lab work & pertinent test results  Airway Mallampati: II  TM Distance: >3 FB Neck ROM: Full    Dental no notable dental hx.    Pulmonary neg pulmonary ROS, Current Smoker,    Pulmonary exam normal breath sounds clear to auscultation       Cardiovascular negative cardio ROS Normal cardiovascular exam Rhythm:Regular Rate:Normal     Neuro/Psych    GI/Hepatic negative GI ROS, Neg liver ROS,   Endo/Other  negative endocrine ROS  Renal/GU negative Renal ROS     Musculoskeletal   Abdominal   Peds negative pediatric ROS (+)  Hematology   Anesthesia Other Findings   Reproductive/Obstetrics                             Anesthesia Physical Anesthesia Plan  ASA: I  Anesthesia Plan: General and Regional   Post-op Pain Management:    Induction:   PONV Risk Score and Plan: Treatment may vary due to age or medical condition  Airway Management Planned: LMA  Additional Equipment:   Intra-op Plan:   Post-operative Plan:   Informed Consent: I have reviewed the patients History and Physical, chart, labs and discussed the procedure including the risks, benefits and alternatives for the proposed anesthesia with the patient or authorized representative who has indicated his/her understanding and acceptance.     Plan Discussed with: CRNA  Anesthesia Plan Comments:         Anesthesia Quick Evaluation

## 2017-08-08 NOTE — Brief Op Note (Signed)
08/08/2017  1:55 PM  PATIENT:  James Meadows  27 y.o. male  PRE-OPERATIVE DIAGNOSIS:  RIGHT SMALL METACARPAL FRACTURE  POST-OPERATIVE DIAGNOSIS:  RIGHT SMALL METACARPAL FRACTURE  PROCEDURE:  Procedure(s): OPEN REDUCTION INTERNAL FIXATION (ORIF) RIGHT SMALL METACARPAL (Right)  SURGEON:  Surgeon(s) and Role:    * Betha LoaKuzma, Anvith Mauriello, MD - Primary  PHYSICIAN ASSISTANT:   ASSISTANTS: Cindee SaltGary Anique Beckley, MD   ANESTHESIA:   regional and general  EBL:  Minimal   BLOOD ADMINISTERED:none  DRAINS: none   LOCAL MEDICATIONS USED:  NONE  SPECIMEN:  No Specimen  DISPOSITION OF SPECIMEN:  N/A  COUNTS:  YES  TOURNIQUET:  Right arm: 33 minutes at 250 mmHg  DICTATION: .Other Dictation: Dictation Number [id number]  PLAN OF CARE: Discharge to home after PACU  PATIENT DISPOSITION:  PACU - hemodynamically stable.

## 2017-08-08 NOTE — Anesthesia Postprocedure Evaluation (Signed)
Anesthesia Post Note  Patient: James Meadows  Procedure(s) Performed: OPEN REDUCTION INTERNAL FIXATION (ORIF) RIGHT SMALL METACARPAL (Right Hand)     Patient location during evaluation: PACU Anesthesia Type: Regional and General Level of consciousness: awake and alert Pain management: pain level controlled Vital Signs Assessment: post-procedure vital signs reviewed and stable Respiratory status: spontaneous breathing, nonlabored ventilation, respiratory function stable and patient connected to nasal cannula oxygen Cardiovascular status: blood pressure returned to baseline and stable Postop Assessment: no apparent nausea or vomiting Anesthetic complications: no    Last Vitals:  Vitals:   08/08/17 1430 08/08/17 1500  BP: (!) 145/87 128/86  Pulse: 68 76  Resp: 19 20  Temp:  36.7 C  SpO2: 100% 100%    Last Pain:  Vitals:   08/08/17 1500  TempSrc:   PainSc: 0-No pain                 Trevor IhaStephen A Jianna Drabik

## 2017-08-08 NOTE — Progress Notes (Signed)
Assisted Dr. Houser with right, ultrasound guided, supraclavicular block. Side rails up, monitors on throughout procedure. See vital signs in flow sheet. Tolerated Procedure well. 

## 2017-08-08 NOTE — Discharge Instructions (Addendum)
° °  ° ° ° °Hand Center Instructions °Hand Surgery ° °Wound Care: °Keep your hand elevated above the level of your heart.  Do not allow it to dangle by your side.  Keep the dressing dry and do not remove it unless your doctor advises you to do so.  He will usually change it at the time of your post-op visit.  Moving your fingers is advised to stimulate circulation but will depend on the site of your surgery.  If you have a splint applied, your doctor will advise you regarding movement. ° °Activity: °Do not drive or operate machinery today.  Rest today and then you may return to your normal activity and work as indicated by your physician. ° °Diet:  °Drink liquids today or eat a light diet.  You may resume a regular diet tomorrow.   ° °General expectations: °Pain for two to three days. °Fingers may become slightly swollen. ° °Call your doctor if any of the following occur: °Severe pain not relieved by pain medication. °Elevated temperature. °Dressing soaked with blood. °Inability to move fingers. °White or bluish color to fingers. ° ° °Regional Anesthesia Blocks ° °1. Numbness or the inability to move the "blocked" extremity may last from 3-48 hours after placement. The length of time depends on the medication injected and your individual response to the medication. If the numbness is not going away after 48 hours, call your surgeon. ° °2. The extremity that is blocked will need to be protected until the numbness is gone and the  Strength has returned. Because you cannot feel it, you will need to take extra care to avoid injury. Because it may be weak, you may have difficulty moving it or using it. You may not know what position it is in without looking at it while the block is in effect. ° °3. For blocks in the legs and feet, returning to weight bearing and walking needs to be done carefully. You will need to wait until the numbness is entirely gone and the strength has returned. You should be able to move your leg  and foot normally before you try and bear weight or walk. You will need someone to be with you when you first try to ensure you do not fall and possibly risk injury. ° °4. Bruising and tenderness at the needle site are common side effects and will resolve in a few days. ° °5. Persistent numbness or new problems with movement should be communicated to the surgeon or the [facility] ([phone])/ [facility] ([phone]). ° ° ° °Post Anesthesia Home Care Instructions ° °Activity: °Get plenty of rest for the remainder of the day. A responsible individual must stay with you for 24 hours following the procedure.  °For the next 24 hours, DO NOT: °-Drive a car °-Operate machinery °-Drink alcoholic beverages °-Take any medication unless instructed by your physician °-Make any legal decisions or sign important papers. ° °Meals: °Start with liquid foods such as gelatin or soup. Progress to regular foods as tolerated. Avoid greasy, spicy, heavy foods. If nausea and/or vomiting occur, drink only clear liquids until the nausea and/or vomiting subsides. Call your physician if vomiting continues. ° °Special Instructions/Symptoms: °Your throat may feel dry or sore from the anesthesia or the breathing tube placed in your throat during surgery. If this causes discomfort, gargle with warm salt water. The discomfort should disappear within 24 hours. ° °If you had a scopolamine patch placed behind your ear for   the management of post- operative nausea and/or vomiting: ° °1. The medication in the patch is effective for 72 hours, after which it should be removed.  Wrap patch in a tissue and discard in the trash. Wash hands thoroughly with soap and water. °2. You may remove the patch earlier than 72 hours if you experience unpleasant side effects which may include dry mouth, dizziness or visual disturbances. °3. Avoid touching the patch. Wash your hands with soap and water after contact with the patch. °  ° ° °

## 2017-08-08 NOTE — Anesthesia Procedure Notes (Signed)
Anesthesia Regional Block: Supraclavicular block   Pre-Anesthetic Checklist: ,, timeout performed, Correct Patient, Correct Site, Correct Laterality, Correct Procedure, Correct Position, site marked, Risks and benefits discussed,  Surgical consent,  Pre-op evaluation,  At surgeon's request and post-op pain management  Laterality: Right  Prep: chloraprep       Needles:  Injection technique: Single-shot  Needle Type: Echogenic Stimulator Needle     Needle Length: 5cm  Needle Gauge: 21     Additional Needles:   Procedures:,,,, ultrasound used (permanent image in chart),,,,  Narrative:  Start time: 08/08/2017 12:45 PM End time: 08/08/2017 12:55 PM Injection made incrementally with aspirations every 5 mL.  Performed by: Personally  Anesthesiologist: Trevor IhaHouser, Asusena Sigley A, MD

## 2017-08-09 ENCOUNTER — Emergency Department (HOSPITAL_COMMUNITY)
Admission: EM | Admit: 2017-08-09 | Discharge: 2017-08-09 | Disposition: A | Discharge status: Home / Self Care | Payer: Medicaid Other | Attending: Emergency Medicine | Admitting: Emergency Medicine

## 2017-08-09 ENCOUNTER — Encounter (HOSPITAL_COMMUNITY): Payer: Self-pay | Admitting: Emergency Medicine

## 2017-08-09 ENCOUNTER — Other Ambulatory Visit: Payer: Self-pay

## 2017-08-09 DIAGNOSIS — T8189XA Other complications of procedures, not elsewhere classified, initial encounter: Secondary | ICD-10-CM | POA: Insufficient documentation

## 2017-08-09 DIAGNOSIS — F17210 Nicotine dependence, cigarettes, uncomplicated: Secondary | ICD-10-CM | POA: Insufficient documentation

## 2017-08-09 DIAGNOSIS — Y798 Miscellaneous orthopedic devices associated with adverse incidents, not elsewhere classified: Secondary | ICD-10-CM | POA: Diagnosis not present

## 2017-08-09 DIAGNOSIS — G8918 Other acute postprocedural pain: Secondary | ICD-10-CM

## 2017-08-09 DIAGNOSIS — R609 Edema, unspecified: Secondary | ICD-10-CM

## 2017-08-09 NOTE — ED Triage Notes (Signed)
PT states he had [facility] right [facility]. PT states his splint is too tight this am and has decreased feeling to his right hand.

## 2017-08-09 NOTE — ED Provider Notes (Signed)
[facility] EMERGENCY DEPARTMENT Provider Note   CSN: [id number] Arrival date & time: 08/09/17  0830     History   Chief Complaint Chief Complaint  Patient presents with  . Post-op Problem    HPI James Meadows is a 27 y.o. male presenting for evaluation of swelling s/p hand surgery yesterday.   Pt has ORIF of R metacarpal yesterday with Dr. Merlyn Lot. Pt states today he had increased swelling and tightness noticed under the splint, at the elbow, and at the thumb. He has been sleeping with his arm elevated. Has not used any antiinflammatories. Symptoms resolved when splint was cut and pressure released. He has a f/u on 4/24. He denies fevers, chills, pain. No other medical problems, does not take medications daily.   HPI  Past Medical History:  Diagnosis Date  . Metacarpal bone fracture 07/2017   right small    Patient Active Problem List   Diagnosis Date Noted  . Suicidal ideation   . Overdose   . Major depressive disorder, recurrent severe without psychotic features (HCC) 04/29/2015  . PTSD (post-traumatic stress disorder) 04/29/2015  . Opioid dependence (HCC) 04/29/2015  . Marijuana abuse 08/22/2012  . ADHD (attention deficit hyperactivity disorder) 08/22/2012  . Episodic mood disorder (HCC) 08/22/2012    Past Surgical History:  Procedure Laterality Date  . NO PAST SURGERIES    . ORIF METACARPAL FRACTURE          Home Medications    Prior to Admission medications   Medication Sig Start Date End Date Taking? Authorizing Provider  HYDROcodone-acetaminophen (NORCO) 7.5-325 MG tablet 1-2 tabs po q6 hours prn pain 08/08/17   Betha Loa, MD    Family History History reviewed. No pertinent family history.  Social History Social History   Tobacco Use  . Smoking status: Current Every Day Smoker    Types: Cigarettes  . Smokeless tobacco: Never Used  . Tobacco comment: 2 cig./day  Substance Use Topics  . Alcohol use: Never    Frequency: Never  . Drug use:  Yes    Types: Marijuana    Comment: daily     Allergies   Latex and Oxycodone   Review of Systems Review of Systems  Musculoskeletal: Positive for joint swelling.  Hematological: Does not bruise/bleed easily.     Physical Exam Updated Vital Signs BP 134/87   Pulse 60   Temp 98.3 F (36.8 C) (Oral)   Resp 16   Ht 5\' 9"  (1.753 m)   Wt 72.6 kg (160 lb)   SpO2 100%   BMI 23.63 kg/m   Physical Exam  Constitutional: He is oriented to person, place, and time. He appears well-developed and well-nourished. No distress.  HENT:  Head: Normocephalic and atraumatic.  Eyes: EOM are normal.  Neck: Normal range of motion.  Pulmonary/Chest: Effort normal.  Abdominal: He exhibits no distension.  Musculoskeletal: He exhibits tenderness. He exhibits no edema.  Well healing incision of R dorsal hand without signs of infection, draiange or bleeding. Decreased sensation of 2nd-5th due to anesthetic. Good cap refill. Full ROM of thumb and 2nd digit, 3-5 in splint  Neurological: He is alert and oriented to person, place, and time. A sensory deficit (post op anesthetic) is present.  Skin: Skin is warm. Capillary refill takes less than 2 seconds. No rash noted.  Psychiatric: He has a normal mood and affect.  Nursing note and vitals reviewed.    ED Treatments / Results  Labs (all labs ordered are listed, but  only abnormal results are displayed) Labs Reviewed - No data to display  EKG None  Radiology No results found.  Procedures Procedures (including critical care time)  Medications Ordered in ED Medications - No data to display   Initial Impression / Assessment and Plan / ED Course  I have reviewed the triage vital signs and the nursing notes.  Pertinent labs & imaging results that were available during my care of the patient were reviewed by me and considered in my medical decision making (see chart for details).     Pt presenting for evaluation of tightness and swelling  after hand surgery yesterday.  Symptoms resolved with removal of the splint.  Good cap refill, good radial pulse.  No signs of infection.  Will consult with Dr. Merlyn LotKuzma regarding replacing the splint and follow-up.  Discussed with Dr. Merlyn LotKuzma, who recommends replacing the splint and follow-up in office on Monday.  Discussed plan with patient.  Discussed importance of anti-inflammatories and keeping his arm elevated.  At this time, patient appears safe for discharge.  Return precautions given.  Patient states he understands and agrees to plan.   Final Clinical Impressions(s) / ED Diagnoses   Final diagnoses:  Post-op pain  Swelling    ED Discharge Orders    None       Alveria ApleyCaccavale, Glessie Eustice, PA-C 08/09/17 1505    Bethann BerkshireZammit, Joseph, MD 08/13/17 [phone]
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
## 2017-08-09 NOTE — Discharge Instructions (Addendum)
Keep your arm elevated. Take medication as prescribed. Follow-up with Dr. Merlyn LotKuzma on Monday. Return to the emergency room if you develop worsening swelling, worsening numbness, increasing pain, or any new or concerning symptoms.

## 2017-08-12 ENCOUNTER — Encounter (HOSPITAL_BASED_OUTPATIENT_CLINIC_OR_DEPARTMENT_OTHER): Payer: Self-pay | Admitting: Orthopedic Surgery

## 2017-09-04 ENCOUNTER — Emergency Department (HOSPITAL_COMMUNITY): Payer: Medicaid Other

## 2017-09-04 ENCOUNTER — Emergency Department (HOSPITAL_COMMUNITY)
Admission: EM | Admit: 2017-09-04 | Discharge: 2017-09-04 | Disposition: A | Discharge status: Home / Self Care | Payer: Medicaid Other | Attending: Emergency Medicine | Admitting: Emergency Medicine

## 2017-09-04 ENCOUNTER — Encounter (HOSPITAL_COMMUNITY): Payer: Self-pay

## 2017-09-04 DIAGNOSIS — Z91040 Latex allergy status: Secondary | ICD-10-CM | POA: Diagnosis not present

## 2017-09-04 DIAGNOSIS — W500XXA Accidental hit or strike by another person, initial encounter: Secondary | ICD-10-CM | POA: Diagnosis not present

## 2017-09-04 DIAGNOSIS — Y929 Unspecified place or not applicable: Secondary | ICD-10-CM | POA: Diagnosis not present

## 2017-09-04 DIAGNOSIS — Y998 Other external cause status: Secondary | ICD-10-CM | POA: Insufficient documentation

## 2017-09-04 DIAGNOSIS — Y939 Activity, unspecified: Secondary | ICD-10-CM | POA: Insufficient documentation

## 2017-09-04 DIAGNOSIS — S6991XA Unspecified injury of right wrist, hand and finger(s), initial encounter: Secondary | ICD-10-CM | POA: Diagnosis not present

## 2017-09-04 DIAGNOSIS — F17210 Nicotine dependence, cigarettes, uncomplicated: Secondary | ICD-10-CM | POA: Diagnosis not present
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
## 2017-09-04 NOTE — Discharge Instructions (Addendum)
As discussed, your x-rays did not show any new injury, fracture or dislocation.  Make sure that you wear your splint and follow-up with orthopedics.  Follow the rice protocol outlined in these instructions which include rest, ice, compress and elevate.  Ibuprofen as needed for pain and swelling.  Return if symptoms worsen, loss of sensation, swelling, redness, fever or other new concerning symptoms in the meantime.

## 2017-09-13 ENCOUNTER — Emergency Department (HOSPITAL_COMMUNITY): Payer: Medicaid Other

## 2017-09-13 ENCOUNTER — Other Ambulatory Visit: Payer: Self-pay

## 2017-09-13 ENCOUNTER — Emergency Department (HOSPITAL_COMMUNITY)
Admission: EM | Admit: 2017-09-13 | Discharge: 2017-09-13 | Discharge status: Against Medical Advice | Payer: Medicaid Other | Attending: Emergency Medicine | Admitting: Emergency Medicine

## 2017-09-13 ENCOUNTER — Encounter (HOSPITAL_COMMUNITY): Payer: Self-pay | Admitting: Emergency Medicine

## 2017-09-13 DIAGNOSIS — R112 Nausea with vomiting, unspecified: Secondary | ICD-10-CM | POA: Diagnosis not present

## 2017-09-13 DIAGNOSIS — F17210 Nicotine dependence, cigarettes, uncomplicated: Secondary | ICD-10-CM | POA: Diagnosis not present

## 2017-09-13 DIAGNOSIS — R109 Unspecified abdominal pain: Secondary | ICD-10-CM | POA: Insufficient documentation

## 2017-09-13 LAB — COMPREHENSIVE METABOLIC PANEL
ALBUMIN: 4.7 g/dL (ref 3.5–5.0)
ALK PHOS: 71 U/L (ref 38–126)
ALT: 13 U/L — ABNORMAL LOW (ref 17–63)
ANION GAP: 11 (ref 5–15)
AST: 19 U/L (ref 15–41)
BUN: 14 mg/dL (ref 6–20)
CALCIUM: 9.7 mg/dL (ref 8.9–10.3)
CO2: 26 mmol/L (ref 22–32)
Chloride: 101 mmol/L (ref 101–111)
Creatinine, Ser: 1 mg/dL (ref 0.61–1.24)
GFR calc Af Amer: >60 mL/min (ref >60)
GFR calc non Af Amer: >60 mL/min (ref >60)
Glucose, Bld: 121 mg/dL — ABNORMAL HIGH (ref 65–99)
POTASSIUM: 3.7 mmol/L (ref 3.5–5.1)
SODIUM: 138 mmol/L (ref 135–145)
Total Bilirubin: 1 mg/dL (ref 0.3–1.2)
Total Protein: 8.2 g/dL — ABNORMAL HIGH (ref 6.5–8.1)

## 2017-09-13 LAB — CBC
HEMATOCRIT: 46.7 % (ref 39.0–52.0)
HEMOGLOBIN: 15.9 g/dL (ref 13.0–17.0)
MCH: 29.8 pg (ref 26.0–34.0)
MCHC: 34 g/dL (ref 30.0–36.0)
MCV: 87.6 fL (ref 78.0–100.0)
Platelets: 245 10*3/uL (ref 150–400)
RBC: 5.33 MIL/uL (ref 4.22–5.81)
RDW: 12.2 % (ref 11.5–15.5)
WBC: 12 10*3/uL — ABNORMAL HIGH (ref 4.0–10.5)

## 2017-09-13 LAB — LIPASE, BLOOD: Lipase: 18 U/L (ref 11–51)

## 2017-09-13 MED ORDER — IOPAMIDOL (ISOVUE-300) INJECTION 61%: 100.0000 mL | Freq: Once | INTRAVENOUS | Status: DC | PRN

## 2017-09-13 MED ORDER — ONDANSETRON HCL 4 MG/2ML IJ SOLN
4.0000 mg | Freq: Once | INTRAMUSCULAR | Status: AC
  Administered 2017-09-13: 4 mg via INTRAVENOUS
  Filled 2017-09-13: qty 2

## 2017-09-13 NOTE — ED Triage Notes (Signed)
Patient reports abdominal pain since seen here 3 weeks ago. Patient states he has been unable to eat the past 3 days.

## 2017-09-13 NOTE — ED Notes (Signed)
Pt left AMA, without telling anyone.

## 2017-09-13 NOTE — ED Provider Notes (Signed)
[facility] EMERGENCY DEPARTMENT Provider Note   CSN: [id number] Arrival date & time: 09/13/17  2113     History   Chief Complaint Chief Complaint  Patient presents with  . Abdominal Pain    HPI James Meadows is a 27 y.o. male.  HPI   James Meadows is a 27 y.o. male who presents to the Emergency Department complaining of persistent abdominal pain for 1 month.  He states he was seen here 1-2 month ago for same and is continued to have sharp abdominal pain and intermittent vomiting.  He states that he can tolerate dark sodas, but begins to have vomiting when attempting to eat food or clear fluids.  He states that he was referred to gastroenterology but appointment was canceled.  Patient has not called to reschedule.  He denies fever, chills, chest pain, back pain or dysuria.  Nothing makes his symptoms better.  No history of previous abdominal surgeries.  Past Medical History:  Diagnosis Date  . Metacarpal bone fracture 07/2017   right small    Patient Active Problem List   Diagnosis Date Noted  . Suicidal ideation   . Overdose   . Major depressive disorder, recurrent severe without psychotic features (HCC) 04/29/2015  . PTSD (post-traumatic stress disorder) 04/29/2015  . Opioid dependence (HCC) 04/29/2015  . Marijuana abuse 08/22/2012  . ADHD (attention deficit hyperactivity disorder) 08/22/2012  . Episodic mood disorder (HCC) 08/22/2012    Past Surgical History:  Procedure Laterality Date  . NO PAST SURGERIES    . OPEN REDUCTION INTERNAL FIXATION (ORIF) METACARPAL Right 08/08/2017   Procedure: OPEN REDUCTION INTERNAL FIXATION (ORIF) RIGHT SMALL METACARPAL;  Surgeon: Betha Loa, MD;  Location: [facility];  Service: Orthopedics;  Laterality: Right;  . ORIF METACARPAL FRACTURE          Home Medications    Prior to Admission medications   Medication Sig Start Date End Date Taking? Authorizing Provider  HYDROcodone-acetaminophen (NORCO) 7.5-325 MG  tablet 1-2 tabs po q6 hours prn pain Patient not taking: Reported on 09/04/2017 08/08/17   Betha Loa, MD    Family History History reviewed. No pertinent family history.  Social History Social History   Tobacco Use  . Smoking status: Current Every Day Smoker    Types: Cigarettes  . Smokeless tobacco: Never Used  . Tobacco comment: 2 cig./day  Substance Use Topics  . Alcohol use: Never    Frequency: Never  . Drug use: Yes    Types: Marijuana    Comment: daily     Allergies   Latex and Oxycodone   Review of Systems Review of Systems  Constitutional: Negative for appetite change, chills and fever.  Respiratory: Negative for shortness of breath.   Cardiovascular: Negative for chest pain.  Gastrointestinal: Positive for abdominal pain, nausea and vomiting. Negative for blood in stool.  Genitourinary: Negative for decreased urine volume, difficulty urinating, dysuria and flank pain.  Musculoskeletal: Negative for back pain.  Skin: Negative for color change and rash.  Neurological: Negative for dizziness, weakness and numbness.  Hematological: Negative for adenopathy.  All other systems reviewed and are negative.    Physical Exam Updated Vital Signs BP 117/72 (BP Location: Left Arm)   Pulse 90   Temp 99.3 F (37.4 C)   Resp 20   Ht  (1.753 m)   Wt 72.6 kg (160 lb)   SpO2 98%   BMI 23.63 kg/m   Physical Exam  Constitutional: He is oriented to  person, place, and time. He appears well-nourished. He does not appear ill. No distress.  HENT:  Mouth/Throat: Oropharynx is clear and moist.  Neck: Normal range of motion. Neck supple.  Cardiovascular: Normal rate and regular rhythm.  Pulmonary/Chest: Effort normal and breath sounds normal. No respiratory distress.  Abdominal: Soft. He exhibits no distension and no mass. There is tenderness. There is no guarding.  Mild tenderness of the lower abdomen.  No guarding or rebound tenderness.  Abdomen is soft    Musculoskeletal: Normal range of motion.  Neurological: He is alert and oriented to person, place, and time. No sensory deficit.  Skin: Skin is warm. Capillary refill takes less than 2 seconds. No rash noted.  Nursing note and vitals reviewed.    ED Treatments / Results  Labs (all labs ordered are listed, but only abnormal results are displayed) Labs Reviewed  COMPREHENSIVE METABOLIC PANEL - Abnormal; Notable for the following components:      Result Value   Glucose, Bld 121 (*)    Total Protein 8.2 (*)    ALT 13 (*)    All other components within normal limits  CBC - Abnormal; Notable for the following components:   WBC 12.0 (*)    All other components within normal limits  LIPASE, BLOOD  URINALYSIS, ROUTINE W REFLEX MICROSCOPIC    EKG None  Radiology No results found.  Procedures Procedures (including critical care time)  Medications Ordered in ED Medications  iopamidol (ISOVUE-300) 61 % injection 100 mL (has no administration in time range)  ondansetron (ZOFRAN) injection 4 mg (4 mg Intravenous Given 09/13/17 2300)     Initial Impression / Assessment and Plan / ED Course  I have reviewed the triage vital signs and the nursing notes.  Pertinent labs & imaging results that were available during my care of the patient were reviewed by me and considered in my medical decision making (see chart for details).    CT abdomen pelvis ordered along with labs.  Patient well-appearing On review of medical records, patient has not been seen here for abdominal pain since January   2340  I was notified by nursing staff that pt refused IV and left the dept. without notifying staff or myself.    Final Clinical Impressions(s) / ED Diagnoses   Final diagnoses:  Abdominal pain, unspecified abdominal location    ED Discharge Orders    None       Rosey Bath 09/13/17 2352    Vanetta Mulders, MD 09/17/17 [phone]

## 2017-10-16 ENCOUNTER — Emergency Department (HOSPITAL_COMMUNITY)
Admission: EM | Admit: 2017-10-16 | Discharge: 2017-10-16 | Disposition: A | Discharge status: Other Acute Inpatient Hospital | Payer: Medicaid Other | Attending: Emergency Medicine | Admitting: Emergency Medicine

## 2017-10-16 ENCOUNTER — Encounter (HOSPITAL_COMMUNITY): Payer: Self-pay | Admitting: Emergency Medicine

## 2017-10-16 ENCOUNTER — Encounter (HOSPITAL_COMMUNITY): Payer: Self-pay | Admitting: *Deleted

## 2017-10-16 ENCOUNTER — Inpatient Hospital Stay (HOSPITAL_COMMUNITY)
Admission: AD | Admit: 2017-10-16 | Discharge: 2017-10-19 | DRG: 885 | Disposition: A | Discharge status: Home / Self Care | Payer: Medicaid Other | Source: Ambulatory Visit | Attending: Psychiatry | Admitting: Psychiatry

## 2017-10-16 ENCOUNTER — Other Ambulatory Visit: Payer: Self-pay

## 2017-10-16 DIAGNOSIS — F329 Major depressive disorder, single episode, unspecified: Secondary | ICD-10-CM

## 2017-10-16 DIAGNOSIS — F32A Depression, unspecified: Secondary | ICD-10-CM

## 2017-10-16 DIAGNOSIS — Z91040 Latex allergy status: Secondary | ICD-10-CM | POA: Diagnosis not present

## 2017-10-16 DIAGNOSIS — Z8619 Personal history of other infectious and parasitic diseases: Secondary | ICD-10-CM

## 2017-10-16 DIAGNOSIS — G4700 Insomnia, unspecified: Secondary | ICD-10-CM | POA: Diagnosis present

## 2017-10-16 DIAGNOSIS — F17210 Nicotine dependence, cigarettes, uncomplicated: Secondary | ICD-10-CM | POA: Diagnosis not present

## 2017-10-16 DIAGNOSIS — R45851 Suicidal ideations: Secondary | ICD-10-CM | POA: Insufficient documentation

## 2017-10-16 DIAGNOSIS — F332 Major depressive disorder, recurrent severe without psychotic features: Secondary | ICD-10-CM | POA: Insufficient documentation

## 2017-10-16 DIAGNOSIS — F419 Anxiety disorder, unspecified: Secondary | ICD-10-CM | POA: Diagnosis not present

## 2017-10-16 DIAGNOSIS — F4312 Post-traumatic stress disorder, chronic: Secondary | ICD-10-CM | POA: Diagnosis present

## 2017-10-16 DIAGNOSIS — Z008 Encounter for other general examination: Secondary | ICD-10-CM | POA: Diagnosis not present

## 2017-10-16 DIAGNOSIS — Z736 Limitation of activities due to disability: Secondary | ICD-10-CM | POA: Diagnosis not present

## 2017-10-16 DIAGNOSIS — Z9114 Patient's other noncompliance with medication regimen: Secondary | ICD-10-CM | POA: Diagnosis not present

## 2017-10-16 DIAGNOSIS — R441 Visual hallucinations: Secondary | ICD-10-CM | POA: Diagnosis present

## 2017-10-16 DIAGNOSIS — J020 Streptococcal pharyngitis: Secondary | ICD-10-CM | POA: Diagnosis not present

## 2017-10-16 DIAGNOSIS — F1210 Cannabis abuse, uncomplicated: Secondary | ICD-10-CM | POA: Diagnosis not present

## 2017-10-16 DIAGNOSIS — J029 Acute pharyngitis, unspecified: Secondary | ICD-10-CM | POA: Diagnosis not present

## 2017-10-16 DIAGNOSIS — Z915 Personal history of self-harm: Secondary | ICD-10-CM | POA: Diagnosis not present

## 2017-10-16 DIAGNOSIS — R51 Headache: Secondary | ICD-10-CM | POA: Diagnosis not present

## 2017-10-16 DIAGNOSIS — Z885 Allergy status to narcotic agent status: Secondary | ICD-10-CM

## 2017-10-16 DIAGNOSIS — F4000 Agoraphobia, unspecified: Secondary | ICD-10-CM | POA: Diagnosis present

## 2017-10-16 DIAGNOSIS — Z202 Contact with and (suspected) exposure to infections with a predominantly sexual mode of transmission: Secondary | ICD-10-CM | POA: Diagnosis not present

## 2017-10-16 HISTORY — DX: Depression, unspecified: F32.A

## 2017-10-16 HISTORY — DX: Major depressive disorder, single episode, unspecified: F32.9

## 2017-10-16 LAB — CBC
HCT: 46.8 % (ref 39.0–52.0)
Hemoglobin: 15.9 g/dL (ref 13.0–17.0)
MCH: 29.9 pg (ref 26.0–34.0)
MCHC: 34 g/dL (ref 30.0–36.0)
MCV: 88 fL (ref 78.0–100.0)
Platelets: 272 10*3/uL (ref 150–400)
RBC: 5.32 MIL/uL (ref 4.22–5.81)
RDW: 12.2 % (ref 11.5–15.5)
WBC: 8.3 10*3/uL (ref 4.0–10.5)

## 2017-10-16 LAB — COMPREHENSIVE METABOLIC PANEL
ALT: 15 U/L (ref 0–44)
AST: 23 U/L (ref 15–41)
Albumin: 4.7 g/dL (ref 3.5–5.0)
Alkaline Phosphatase: 85 U/L (ref 38–126)
Anion gap: 8 (ref 5–15)
BUN: 17 mg/dL (ref 6–20)
CO2: 29 mmol/L (ref 22–32)
Calcium: 9.4 mg/dL (ref 8.9–10.3)
Chloride: 104 mmol/L (ref 98–111)
Creatinine, Ser: 1.03 mg/dL (ref 0.61–1.24)
GFR calc Af Amer: >60 mL/min (ref >60)
GFR calc non Af Amer: >60 mL/min (ref >60)
Glucose, Bld: 94 mg/dL (ref 70–99)
Potassium: 4.1 mmol/L (ref 3.5–5.1)
Sodium: 141 mmol/L (ref 135–145)
Total Bilirubin: 1.1 mg/dL (ref 0.3–1.2)
Total Protein: 8.3 g/dL — ABNORMAL HIGH (ref 6.5–8.1)

## 2017-10-16 LAB — ETHANOL: Alcohol, Ethyl (B): <10 mg/dL (ref <10)

## 2017-10-16 LAB — SALICYLATE LEVEL: Salicylate Lvl: <7 mg/dL (ref 2.8–30.0)

## 2017-10-16 LAB — ACETAMINOPHEN LEVEL: Acetaminophen (Tylenol), Serum: <10 ug/mL — ABNORMAL LOW (ref 10–30)

## 2017-10-16 MED ORDER — PRAZOSIN HCL 1 MG PO CAPS
1.0000 mg | ORAL_CAPSULE | Freq: Every day | ORAL | Status: DC
  Administered 2017-10-16 – 2017-10-18 (×3): 1 mg via ORAL
  Filled 2017-10-16: qty 7
  Filled 2017-10-16 (×4): qty 1
  Filled 2017-10-16: qty 7
  Filled 2017-10-16: qty 1

## 2017-10-16 MED ORDER — QUETIAPINE FUMARATE ER 50 MG PO TB24
50.0000 mg | ORAL_TABLET | Freq: Every day | ORAL | Status: DC
  Administered 2017-10-16: 50 mg via ORAL
  Filled 2017-10-16 (×4): qty 1

## 2017-10-16 NOTE — Progress Notes (Signed)
James Meadows is a 27 year old male pt admitted on voluntary basis from [facility]. On admission he spoke about how he has been having flashbacks of seeing his dead girlfriend and reports this happened about 7 years ago. He reports that he starts to have these flashbacks around this time of the year. He reports that he was feeling suicidal and unsafe which is why he came into the hospital. He denies current SI on admission and is able to contract for safety on the unit. He reports that he has someone he talks to when he starts to feel like this but was unable to reach them. He reports that he never really went through the grieving process in regards to her death. He denies any substance abuse issues and reports that he only uses marijuana on occasion. He reports that he was on medications but has been off medications for the past 3 years and feels like he may need to get back on them. He reports that he lives with his girlfriend and reports that he will go back with her once he is discharged. He was oriented to the unit and safety maintained.

## 2017-10-16 NOTE — Tx Team (Signed)
Initial Treatment Plan 10/16/2017 8:15 PM James Meadows James Meadows [id number]    PATIENT STRESSORS: Medication change or noncompliance Other: flashbacks of dead girlfriend   PATIENT STRENGTHS: Ability for insight Average or above average intelligence Capable of independent living Communication skills General fund of knowledge Motivation for treatment/growth Supportive family/friends   PATIENT IDENTIFIED PROBLEMS: Depression Suicidal thoughts "Get back on my medications" "Having flashbacks of seeing my dead girlfriend"                     DISCHARGE CRITERIA:  Ability to meet basic life and health needs Improved stabilization in mood, thinking, and/or behavior Reduction of life-threatening or endangering symptoms to within safe limits Verbal commitment to aftercare and medication compliance  PRELIMINARY DISCHARGE PLAN: Attend aftercare/continuing care group Return to previous living arrangement  PATIENT/FAMILY INVOLVEMENT: This treatment plan has been presented to and reviewed with the patient, James Meadows, and/or family member, .  The patient and family have been given the opportunity to ask questions and make suggestions.  James Meadows, [place], [place] 10/16/2017, 8:15 PM

## 2017-10-16 NOTE — ED Triage Notes (Signed)
Pt states "I just want to get help before I take a bunch of pills".  Pt c/o of feeling depressed and suicidal.  Pt states having a plan to kill himself by pills. Pt states seeing someone who is not there.

## 2017-10-16 NOTE — ED Provider Notes (Signed)
[facility] EMERGENCY DEPARTMENT Provider Note   CSN: [id number] Arrival date & time: 10/16/17  1306     History   Chief Complaint Chief Complaint  Patient presents with  . Suicidal    HPI James Meadows is a 27 y.o. male.  HPI   27 year old male with suicidal ideation.  Plans to overdose.  Patient has been dealing with depression and suicidal thoughts for years.  Increasing over the last 2 days.  Worsening near the anniversary of his girlfriends death by suicide.  She denies any homicidal ideation.  Denies any hallucinations.  Past Medical History:  Diagnosis Date  . Depression   . Metacarpal bone fracture 07/2017   right small    Patient Active Problem List   Diagnosis Date Noted  . Suicidal ideation   . Overdose   . Major depressive disorder, recurrent severe without psychotic features (HCC) 04/29/2015  . PTSD (post-traumatic stress disorder) 04/29/2015  . Opioid dependence (HCC) 04/29/2015  . Marijuana abuse 08/22/2012  . ADHD (attention deficit hyperactivity disorder) 08/22/2012  . Episodic mood disorder (HCC) 08/22/2012    Past Surgical History:  Procedure Laterality Date  . NO PAST SURGERIES    . OPEN REDUCTION INTERNAL FIXATION (ORIF) METACARPAL Right 08/08/2017   Procedure: OPEN REDUCTION INTERNAL FIXATION (ORIF) RIGHT SMALL METACARPAL;  Surgeon: Betha Loa, MD;  Location: [facility];  Service: Orthopedics;  Laterality: Right;  . ORIF METACARPAL FRACTURE          Home Medications    Prior to Admission medications   Medication Sig Start Date End Date Taking? Authorizing Provider  HYDROcodone-acetaminophen (NORCO) 7.5-325 MG tablet 1-2 tabs po q6 hours prn pain Patient not taking: Reported on 09/04/2017 08/08/17   Betha Loa, MD    Family History History reviewed. No pertinent family history.  Social History Social History   Tobacco Use  . Smoking status: Current Some Day Smoker    Types: Cigarettes  . Smokeless tobacco:  Never Used  . Tobacco comment: 2 cig./day  Substance Use Topics  . Alcohol use: Never    Frequency: Never  . Drug use: Yes    Types: Marijuana    Comment: daily     Allergies   Latex and Oxycodone   Review of Systems Review of Systems All systems reviewed and negative, other than as noted in HPI. Physical Exam Updated Vital Signs BP (!) 132/91 (BP Location: Right Arm)   Pulse 83   Temp 98.8 F (37.1 C) (Oral)   Resp 18   Ht 5\' 9"  (1.753 m)   Wt 72.6 kg (160 lb)   SpO2 98%   BMI 23.63 kg/m   Physical Exam  Constitutional: He appears well-developed and well-nourished. No distress.  HENT:  Head: Normocephalic and atraumatic.  Eyes: Conjunctivae are normal. Right eye exhibits no discharge. Left eye exhibits no discharge.  Neck: Neck supple.  Cardiovascular: Normal rate, regular rhythm and normal heart sounds. Exam reveals no gallop and no friction rub.  No murmur heard. Pulmonary/Chest: Effort normal and breath sounds normal. No respiratory distress.  Abdominal: Soft. He exhibits no distension. There is no tenderness.  Musculoskeletal: He exhibits no edema or tenderness.  Neurological: He is alert.  Skin: Skin is warm and dry.  Psychiatric:  Flat affect and very guarded.  Speech is clear.  Content appropriate.  Nursing note and vitals reviewed.    ED Treatments / Results  Labs (all labs ordered are listed, but only abnormal results are displayed) Labs  Reviewed  COMPREHENSIVE METABOLIC PANEL - Abnormal; Notable for the following components:      Result Value   Total Protein 8.3 (*)    All other components within normal limits  ACETAMINOPHEN LEVEL - Abnormal; Notable for the following components:   Acetaminophen (Tylenol), Serum <10 (*)    All other components within normal limits  ETHANOL  SALICYLATE LEVEL  CBC  RAPID URINE DRUG SCREEN, HOSP PERFORMED    EKG None  Radiology No results found.  Procedures Procedures (including critical care  time)  Medications Ordered in ED Medications - No data to display   Initial Impression / Assessment and Plan / ED Course  I have reviewed the triage vital signs and the nursing notes.  Pertinent labs & imaging results that were available during my care of the patient were reviewed by me and considered in my medical decision making (see chart for details).     27 year old male with depression and suicidal ideation.  Medically cleared.  TTS evaluation.  Final Clinical Impressions(s) / ED Diagnoses   Final diagnoses:  Depression, unspecified depression type  Suicidal thoughts    ED Discharge Orders    None       Raeford RazorKohut, Meekah Math, MD 10/16/17 1701

## 2017-10-16 NOTE — BH Assessment (Signed)
Tele Assessment Note   Patient Name: James Meadows MRN: [id number] Referring Physician: Dr. Juleen China Location of Patient: [facility] Location of Provider: Behavioral Health TTS Department  James Meadows is an 27 y.o. male. Pt reports SI with a plan to overdose. Pt denies HI and AVH. Pt states that for 7 years he's had suicidal thoughts, but the last 2 days the thoughts have worsened. The Pt states that his thoughts worsened around the anniversary of his girlfriend's death. The Pt states that he found her dead exactly 7 years ago. His girlfriend had had committed suicide. Pt reports previous hospitalizations. Pt denies current mental health treatment. Pt reports daily marijuana use.   Shuvon, NP recommends inpatient treatment.   Diagnosis: F33.2 MDD   Past Medical History:  Past Medical History:  Diagnosis Date  . Depression   . Metacarpal bone fracture 07/2017   right small    Past Surgical History:  Procedure Laterality Date  . NO PAST SURGERIES    . OPEN REDUCTION INTERNAL FIXATION (ORIF) METACARPAL Right 08/08/2017   Procedure: OPEN REDUCTION INTERNAL FIXATION (ORIF) RIGHT SMALL METACARPAL;  Surgeon: Betha Loa, MD;  Location: [facility];  Service: Orthopedics;  Laterality: Right;  . ORIF METACARPAL FRACTURE      Family History: History reviewed. No pertinent family history.  Social History:  reports that he has been smoking cigarettes.  He has never used smokeless tobacco. He reports that he has current or past drug history. Drug: Marijuana. He reports that he does not drink alcohol.  Additional Social History:  Alcohol / Drug Use Pain Medications: please see mar Prescriptions: please see mar Over the Counter: please see mar History of alcohol / drug use?: Yes Longest period of sobriety (when/how long): unknown Substance #1 Name of Substance 1: marijuana 1 - Age of First Use: unknown 1 - Amount (size/oz): unknown 1 - Frequency: daily 1 - Duration: ongoing 1  - Last Use / Amount: 10/15/17  CIWA: CIWA-Ar BP: (!) 132/91 Pulse Rate: 83 COWS:    Allergies:  Allergies  Allergen Reactions  . Latex Itching  . Oxycodone Rash and Other (See Comments)    FEELS LIKE SKIN IS BURNING    Home Medications:  (Not in a hospital admission)  OB/GYN Status:  No LMP for male patient.  General Assessment Data Location of Assessment: [facility] TTS Assessment: In system Is this a Tele or Face-to-Face Assessment?: Tele Assessment Is this an Initial Assessment or a Re-assessment for this encounter?: Initial Assessment Marital status: Single Maiden name: NA Is patient pregnant?: No Pregnancy Status: No Living Arrangements: Spouse/significant other Can pt return to current living arrangement?: Yes Admission Status: Voluntary Is patient capable of signing voluntary admission?: Yes Referral Source: Self/Family/Friend Insurance type: Medicaid     Crisis Care Plan Living Arrangements: Spouse/significant other Legal Guardian: Other:(self) Name of Psychiatrist: NA Name of Therapist: NA  Education Status Is patient currently in school?: No Is the patient employed, unemployed or receiving disability?: Unemployed  Risk to self with the past 6 months Suicidal Ideation: Yes-Currently Present Has patient been a risk to self within the past 6 months prior to admission? : Yes Suicidal Intent: Yes-Currently Present Has patient had any suicidal intent within the past 6 months prior to admission? : No Is patient at risk for suicide?: Yes Suicidal Plan?: Yes-Currently Present Has patient had any suicidal plan within the past 6 months prior to admission? : No Specify Current Suicidal Plan: to overdose Access to Means: Yes  Specify Access to Suicidal Means: access to pills What has been your use of drugs/alcohol within the last 12 months?: marijuauna Previous Attempts/Gestures: Yes How many times?: 3 Other Self Harm Risks: NA Triggers for Past Attempts:  Anniversary Intentional Self Injurious Behavior: None Family Suicide History: No Recent stressful life event(s): Trauma (Comment) Persecutory voices/beliefs?: No Depression: Yes Depression Symptoms: Tearfulness, Insomnia, Fatigue, Loss of interest in usual pleasures, Feeling angry/irritable, Feeling worthless/self pity Substance abuse history and/or treatment for substance abuse?: No Suicide prevention information given to non-admitted patients: Not applicable  Risk to Others within the past 6 months Homicidal Ideation: No Does patient have any lifetime risk of violence toward others beyond the six months prior to admission? : No Thoughts of Harm to Others: No Current Homicidal Intent: No Current Homicidal Plan: No Access to Homicidal Means: No Identified Victim: NA History of harm to others?: No Assessment of Violence: None Noted Violent Behavior Description: NA Does patient have access to weapons?: No Criminal Charges Pending?: No Does patient have a court date: Yes Court Date: 11/21/17 Is patient on probation?: Yes  Psychosis Hallucinations: None noted Delusions: None noted  Mental Status Report Appearance/Hygiene: Unremarkable Eye Contact: Fair Motor Activity: Freedom of movement Speech: Logical/coherent Level of Consciousness: Alert Mood: Depressed Affect: Depressed Anxiety Level: Moderate Thought Processes: Coherent, Relevant Judgement: Unimpaired Orientation: Person, Place, Time, Situation Obsessive Compulsive Thoughts/Behaviors: None  Cognitive Functioning Concentration: Normal Memory: Recent Intact, Remote Intact Is patient IDD: No Is patient DD?: No Insight: Fair Impulse Control: Fair Appetite: Fair Have you had any weight changes? : No Change Sleep: Decreased Total Hours of Sleep: 3 Vegetative Symptoms: None  ADLScreening [facility] Assessment Services) Patient's cognitive ability adequate to safely complete daily activities?: Yes Patient able to  express need for assistance with ADLs?: Yes Independently performs ADLs?: Yes (appropriate for developmental age)  Prior Inpatient Therapy Prior Inpatient Therapy: Yes Prior Therapy Dates: 2014 and 2017 [facility] Reason for Treatment: depression  Prior Outpatient Therapy Prior Outpatient Therapy: No  ADL Screening (condition at time of admission) Patient's cognitive ability adequate to safely complete daily activities?: Yes Is the patient deaf or have difficulty hearing?: No Does the patient have difficulty concentrating, remembering, or making decisions?: No Patient able to express need for assistance with ADLs?: Yes Does the patient have difficulty dressing or bathing?: No Independently performs ADLs?: Yes (appropriate for developmental age) Does the patient have difficulty walking or climbing stairs?: No Weakness of Legs: None Weakness of Arms/Hands: None       Abuse/Neglect Assessment (Assessment to be complete while patient is alone) Abuse/Neglect Assessment Can Be Completed: Yes Physical Abuse: Denies Verbal Abuse: Denies Sexual Abuse: Denies Exploitation of patient/patient's resources: Denies     Merchant navy officerAdvance Directives (For Healthcare) Does Patient Have a Medical Advance Directive?: No Would patient like information on creating a medical advance directive?: No - Patient declined          Disposition:  Disposition Initial Assessment Completed for this Encounter: Yes  This service was provided via telemedicine using a 2-way, interactive audio and video technology.  Names of all persons participating in this telemedicine service and their role in this encounter. Name: Assunta FoundShuvon Rankin Role: NP  Name:  Role:   Name:  Role:   Name:  Role:     Emmit PomfretLevette,Sonam Wandel D 10/16/2017 4:21 PM

## 2017-10-16 NOTE — ED Notes (Signed)
Called [facility] for transport to [facility]. 

## 2017-10-17 DIAGNOSIS — F17210 Nicotine dependence, cigarettes, uncomplicated: Secondary | ICD-10-CM

## 2017-10-17 DIAGNOSIS — Z736 Limitation of activities due to disability: Secondary | ICD-10-CM

## 2017-10-17 MED ORDER — QUETIAPINE FUMARATE 50 MG PO TABS
50.0000 mg | ORAL_TABLET | Freq: Every day | ORAL | Status: DC
  Administered 2017-10-17 – 2017-10-18 (×2): 50 mg via ORAL
  Filled 2017-10-17: qty 1
  Filled 2017-10-17: qty 7
  Filled 2017-10-17: qty 1
  Filled 2017-10-17: qty 7
  Filled 2017-10-17: qty 1

## 2017-10-17 MED ORDER — HYDROXYZINE HCL 25 MG PO TABS
25.0000 mg | ORAL_TABLET | Freq: Four times a day (QID) | ORAL | Status: DC | PRN
  Filled 2017-10-17: qty 10

## 2017-10-17 MED ORDER — SERTRALINE HCL 50 MG PO TABS: 50.0000 mg | ORAL_TABLET | Freq: Every day | ORAL | Status: DC

## 2017-10-17 MED ORDER — SERTRALINE HCL 50 MG PO TABS
50.0000 mg | ORAL_TABLET | Freq: Every day | ORAL | Status: DC
  Administered 2017-10-18: 50 mg via ORAL
  Filled 2017-10-17 (×3): qty 1

## 2017-10-17 NOTE — [facility] Group Notes (Signed)
Adult Psychoeducational Group Note  Date:  10/17/2017 Time:  4:16 PM  Group Topic/Focus:  Goals Group:   The focus of this group is to help patients establish daily goals to achieve during treatment and discuss how the patient can incorporate goal setting into their daily lives to aide in recovery.  Participation Level:  Active  Participation Quality:  Appropriate  Affect:  Lethargic  Cognitive:  Appropriate  Insight: Good  Engagement in Group:  Engaged  Modes of Intervention:  Discussion  Additional Comments:  Patient goal for today is to talk to the doctor about discharging home. Patient participated and engaged in group activity on crisis management in the adult unit workbook with encouragement. Patient was able to identify feelings leading up to a crisis situation.      James LaymanBreannah R [name] 10/17/2017, 4:16 PM

## 2017-10-17 NOTE — Plan of Care (Signed)
  Problem: Education: Goal: Emotional status will improve Outcome: Not Progressing   Problem: Activity: Goal: Interest or engagement in activities will improve Outcome: Not Progressing   Problem: Safety: Goal: Periods of time without injury will increase Outcome: Progressing   Problem: Activity: Goal: Interest or engagement in leisure activities will improve Outcome: Not Progressing   Problem: Self-Concept: Goal: Level of anxiety will decrease Outcome: Progressing  DAR NOTE: Patient presents with flat affect and depressed mood.  Patient only forward little during assessment.  Denies suicidal thoughts, auditory and visual hallucinations.  Rates depression at 0, hopelessness at 0, and anxiety at 1.  Maintained on routine safety checks.  Support and encouragement offered as needed.  States goal for today is "talking."  Patient is withdrawn and isolates to his room.  Offered no complaint.
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
## 2017-10-17 NOTE — [facility] Suicide Risk Assessment (Signed)
[facility]   Nursing information obtained from:  Patient Demographic factors:  Male, Adolescent or young adult Current Mental Status:  Suicidal ideation indicated by patient, Self-harm thoughts Loss Factors:  Loss of significant relationship Historical Factors:  Prior suicide attempts, Family history of mental illness or substance abuse Risk Reduction Factors:  Responsible for children under 27 years of age, Living with another person, especially a relative, Positive social support  Total Time spent with patient: 45 minutes Principal Problem: MDD, PTSD by history  Diagnosis:   Patient Active Problem List   Diagnosis Date Noted  . MDD (major depressive disorder), recurrent severe, without psychosis (HCC) [F33.2] 10/16/2017  . Suicidal ideation [R45.851]   . Overdose [T50.901A]   . Major depressive disorder, recurrent severe without psychotic features (HCC) [F33.2] 04/29/2015  . PTSD (post-traumatic stress disorder) [F43.10] 04/29/2015  . Opioid dependence (HCC) [F11.20] 04/29/2015  . Marijuana abuse [F12.10] 08/22/2012  . ADHD (attention deficit hyperactivity disorder) [F90.9] 08/22/2012  . 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  World Science writerHealth Organization [facility](WHO). Score between 0-7:  no or low risk or alcohol related problems. Score between 8-15:  moderate risk of alcohol related problems. Score between 16-19:  high risk of alcohol related problems. Score 20 or above:  warrants further diagnostic evaluation for alcohol dependence and treatment.   CLINICAL FACTORS:  27 year old male, lives with GF, presented to ED voluntarily reporting depression which he attributes to birthday anniversary of a GF who died from suicide in 2014. Reiterates  he does not feel his depression is severe at this time, but expresses worry it could progress and lead to SI ( which he currently denies) if left untreated. Describes history of PTSD and Cannabis Use Disorder.   Psychiatric Specialty Exam: Physical Exam  ROS  Blood pressure (!) 117/98, pulse (!) 136, temperature 99.3 F (37.4 C), temperature source Oral, resp. rate 16, height 5\' 8"  (1.727 m), weight 62.1 kg (137 lb).Body mass index is 20.83 kg/m.  See Admit Note MSE    COGNITIVE FEATURES THAT CONTRIBUTE TO RISK:  Closed-mindedness and Loss of executive function    SUICIDE RISK:   Moderate:  Frequent suicidal ideation with limited intensity, and duration, some specificity in terms of plans, no associated intent, good self-control, limited dysphoria/symptomatology, some risk factors present, and identifiable protective factors, including available and accessible social support.  PLAN OF CARE: Patient will be admitted to inpatient psychiatric unit for stabilization and safety. Will provide and encourage milieu participation. Provide medication management and maked adjustments as needed.  Will follow daily.    I certify that inpatient services furnished can reasonably be expected to improve the patient's condition.   Craige CottaFernando A Cobos, MD 10/17/2017, 5:30 PM

## 2017-10-17 NOTE — [facility] Group Notes (Signed)
[facility] Group Notes:  (Nursing/MHT/Case Management/Adjunct)  Date:  10/17/2017  Time:  5:51 PM  Type of Therapy:  Psychoeducational Skills  Participation Level:  Did Not Attend    James Meadows 10/17/2017, 5:51 PM

## 2017-10-17 NOTE — H&P (Signed)
Psychiatric Admission Assessment Adult  Patient Identification: James Meadows MRN:  [id number] Date of Evaluation:  10/17/2017 Chief Complaint:  " I need to get back on my medications" Principal Diagnosis: MDD, PTSD by history Diagnosis:   Patient Active Problem List   Diagnosis Date Noted  . MDD (major depressive disorder), recurrent severe, without psychosis ([place]) [F33.2] 10/16/2017  . Suicidal ideation [R45.851]   . Overdose [T50.901A]   . Major depressive disorder, recurrent severe without psychotic features ([place]) [F33.2] 04/29/2015  . PTSD (post-traumatic stress disorder) [F43.10] 04/29/2015  . Opioid dependence ([place]) [F11.20] 04/29/2015  . Marijuana abuse [F12.10] 08/22/2012  . ADHD (attention deficit hyperactivity disorder) [F90.9] 08/22/2012  . Episodic mood disorder ([place]) [F39] 08/22/2012   History of Present Illness: 27 year old male, single, lives with GF. Presented to the ED voluntarily epristeride. He reports worsening depression over recent days to weeks. States " it is not that bad but I want to get back on medications so it does not get worse". He denies suicidal ideations, but states " I don't want to let my depression get worse to where I do want to hurt myself ".He lost a GF to suicide in 2014, reports he found her, and has had PTSD and depression symptoms since then ,which wax and wane . States that this is around the time of her birthday  anniversary which has caused him to feel more depressed. States that he often feels worse around her birthday or death anniversaries . Endorses some depression but currently denies neuro-vegetative symptoms of depression and denies changes in appetite, sleep, energy level or any pervasive sense of anhedonia. Associated Signs/Symptoms: Depression Symptoms:  depressed mood, (Hypo) Manic Symptoms:  Denies  Anxiety Symptoms:  Endorses agoraphobia, but denies panic attacks. Psychotic Symptoms:  Denies  PTSD Symptoms: Reports PTSD symptoms  related to finding his GF who had committed suicide by hanging self in 2014. Reports history of nightmares , intrusive recollections but states symptoms have tended to improve overtime and states last nightmare was about two months ago.  Total Time spent with patient: 45 minutes  Past Psychiatric History: one prior psychiatric admission in 2017 related to depression/ suicide attempt by overdose. He denies any history of self cutting or self injurious behaviors, Denies history of violence, denies history of psychosis, denies history of mania or hypomania. Denies panic attacks but endorses some agoraphobia. Denies excessive worrying or other anxiety symptoms.   Is the patient at risk to self? Yes.    Has the patient been a risk to self in the past 6 months? No.  Has the patient been a risk to self within the distant past? Yes.    Is the patient a risk to others? No.  Has the patient been a risk to others in the past 6 months? No.  Has the patient been a risk to others within the distant past? No.   Prior Inpatient Therapy:  as above Prior Outpatient Therapy:  no current outpatient treatment  Alcohol Screening: 1. How often do you have a drink containing alcohol?: Monthly or less 2. How many drinks containing alcohol do you have on a typical day when you are drinking?: 1 or 2 3. How often do you have six or more drinks on one occasion?: Never AUDIT-C Score: 1 4. How often during the last year have you found that you were not able to stop drinking once you had started?: Never 5. How often during the last year have you failed to do  what was normally expected from you becasue of drinking?: Never 6. How often during the last year have you needed a first drink in the morning to get yourself going after a heavy drinking session?: Never 7. How often during the last year have you had a feeling of guilt of remorse after drinking?: Never 8. How often during the last year have you been unable to remember  what happened the night before because you had been drinking?: Never 9. Have you or someone else been injured as a result of your drinking?: No 10. Has a relative or friend or a doctor or another health worker been concerned about your drinking or suggested you cut down?: No Alcohol Use Disorder Identification Test Final Score (AUDIT): 1 Intervention/Follow-up: AUDIT Score <7 follow-up not indicated Substance Abuse History in the last 12 months:  Denies  Alcohol abuse, endorses daily cannabis use, denies other drug abuse . Consequences of Substance Abuse: Denies  Previous Psychotropic Medications:  Was not taking any psychiatric medications prior to admission. He had been prescribed Trazodone and Minipress in 2017 but has not taken in more than a year  Psychological Evaluations:  No Past Medical History: denies medical illnesses. NKDA.  Past Medical History:  Diagnosis Date  . Depression   . Metacarpal bone fracture 07/2017   right small    Past Surgical History:  Procedure Laterality Date  . NO PAST SURGERIES    . OPEN REDUCTION INTERNAL FIXATION (ORIF) METACARPAL Right 08/08/2017   Procedure: OPEN REDUCTION INTERNAL FIXATION (ORIF) RIGHT SMALL METACARPAL;  Surgeon: Leanora Cover, MD;  Location: [place];  Service: Orthopedics;  Laterality: Right;  . ORIF METACARPAL FRACTURE     Family History: parents alive, together , live in [place]. Has 5 siblings .Patient is second to youngest . Family Psychiatric  History: denies history of mental illness in family, no history of suicides in family, no history of drug or alcohol abuse in family. Tobacco Screening: Smokes 1/2 PPD Social History: 27 year old single male, has 5 children ( ranging in ages from 50 years old to 62 months old), lives with GF, who is the mother of the three youngest, currently unemployed . On disability. Social History   Substance and Sexual Activity  Alcohol Use Not Currently  . Frequency: Never      Social History   Substance and Sexual Activity  Drug Use Yes  . Types: Marijuana   Comment: daily    Additional Social History:  Allergies:   Allergies  Allergen Reactions  . Latex Itching  . Oxycodone Rash and Other (See Comments)    FEELS LIKE SKIN IS BURNING   Lab Results:  Results for orders placed or performed during the hospital encounter of 10/16/17 (from the past 48 hour(s))  Comprehensive metabolic panel     Status: Abnormal   Collection Time: 10/16/17  1:28 PM  Result Value Ref Range   Sodium 141 135 - 145 mmol/L   Potassium 4.1 3.5 - 5.1 mmol/L   Chloride 104 98 - 111 mmol/L    Comment: Please note change in reference range.   CO2 29 22 - 32 mmol/L   Glucose, Bld 94 70 - 99 mg/dL    Comment: Please note change in reference range.   BUN 17 6 - 20 mg/dL    Comment: Please note change in reference range.   Creatinine, Ser 1.03 0.61 - 1.24 mg/dL   Calcium 9.4 8.9 - 10.3 mg/dL   Total Protein 8.3 (  H) 6.5 - 8.1 g/dL   Albumin 4.7 3.5 - 5.0 g/dL   AST 23 15 - 41 U/L   ALT 15 0 - 44 U/L    Comment: Please note change in reference range.   Alkaline Phosphatase 85 38 - 126 U/L   Total Bilirubin 1.1 0.3 - 1.2 mg/dL   GFR calc non Af Amer >60 >60 mL/min   GFR calc Af Amer >60 >60 mL/min    Comment: (NOTE) The eGFR has been calculated using the CKD EPI equation. This calculation has not been validated in all clinical situations. eGFR's persistently <60 mL/min signify possible Chronic Kidney Disease.    Anion gap 8 5 - 15    Comment: Performed at [facility], [address] [place], [place] [address]  Ethanol     Status: None   Collection Time: 10/16/17  1:28 PM  Result Value Ref Range   Alcohol, Ethyl (B) <10 <10 mg/dL    Comment: (NOTE) Lowest detectable limit for serum alcohol is 10 mg/dL. For medical purposes only. Performed at [facility], [address] [place], [place] [address]   Salicylate level     Status: None   Collection Time: 10/16/17  1:28 PM   Result Value Ref Range   Salicylate Lvl <0.2 2.8 - 30.0 mg/dL    Comment: Performed at [facility], [address], [place], [place] [address]  Acetaminophen level     Status: Abnormal   Collection Time: 10/16/17  1:28 PM  Result Value Ref Range   Acetaminophen (Tylenol), Serum <10 (L) 10 - 30 ug/mL    Comment: (NOTE) Therapeutic concentrations vary significantly. A range of 10-30 ug/mL  may be an effective concentration for many patients. However, some  are best treated at concentrations outside of this range. Acetaminophen concentrations >150 ug/mL at 4 hours after ingestion  and >50 ug/mL at 12 hours after ingestion are often associated with  toxic reactions. Performed at [facility], [address] [place], [place] [address]   cbc     Status: None   Collection Time: 10/16/17  1:28 PM  Result Value Ref Range   WBC 8.3 4.0 - 10.5 K/uL   RBC 5.32 4.22 - 5.81 MIL/uL   Hemoglobin 15.9 13.0 - 17.0 g/dL   HCT 46.8 39.0 - 52.0 %   MCV 88.0 78.0 - 100.0 fL   MCH 29.9 26.0 - 34.0 pg   MCHC 34.0 30.0 - 36.0 g/dL   RDW 12.2 11.5 - 15.5 %   Platelets 272 150 - 400 K/uL    Comment: Performed at [facility], [address] [place],  [address]    Blood Alcohol level:  Lab Results  Component Value Date   ETH <10 10/16/2017   ETH <5 47/12/6281    Metabolic Disorder Labs:  No results found for: HGBA1C, MPG No results found for: PROLACTIN No results found for: CHOL, TRIG, HDL, CHOLHDL, VLDL, LDLCALC  Current Medications: Current Facility-Administered Medications  Medication Dose Route Frequency Provider Last Rate Last Dose  . prazosin (MINIPRESS) capsule 1 mg  1 mg Oral QHS Rankin, Shuvon B, NP   1 mg at 10/16/17 2118  . QUEtiapine (SEROQUEL XR) 24 hr tablet 50 mg  50 mg Oral QHS Rankin, Shuvon B, NP   50 mg at 10/16/17 2118   PTA Medications: No medications prior to admission.    Musculoskeletal: Strength & Muscle Tone: within normal limits Gait & Station:  normal Patient leans: N/A  Psychiatric Specialty  Exam: Physical Exam  Review of Systems  Constitutional: Negative.   HENT: Negative.   Eyes: Negative.   Respiratory: Negative.   Cardiovascular: Negative.   Gastrointestinal: Negative.   Genitourinary: Negative.   Musculoskeletal: Negative.   Skin: Negative.   Neurological: Positive for headaches. Negative for seizures.  Endo/Heme/Allergies: Negative.   Psychiatric/Behavioral: Positive for depression.    Blood pressure (!) 117/98, pulse (!) 136, temperature 99.3 F (37.4 C), temperature source Oral, resp. rate 16, height '5\' 8"'$  (1.727 m), weight 62.1 kg (137 lb).Body mass index is 20.83 kg/m.  General Appearance: Fairly Groomed  Eye Contact:  Good  Speech:  Normal Rate  Volume:  Normal  Mood:  Depressed  Affect:  vaguely constricted   Thought Process:  Linear and Descriptions of Associations: Intact  Orientation:  Other:  fully alert and attentive   Thought Content:  no hallucinations, no delusions, not internally preoccupied   Suicidal Thoughts:  No denies any suicidal or self injurious ideations, denies any homicidal or violent ideations  Homicidal Thoughts:  No  Memory:  recent and remote grossly intact   Judgement:  Fair  Insight:  Fair  Psychomotor Activity:  Normal  Concentration:  Concentration: Good and Attention Span: Good  Recall:  Good  Fund of Knowledge:  Good  Language:  Good  Akathisia:  Negative  Handed:  Left  AIMS (if indicated):     Assets:  Communication Skills Desire for Improvement Resilience  ADL's:  Intact  Cognition:  WNL  Sleep:       Treatment Plan Summary: Daily contact with patient to assess and evaluate symptoms and progress in treatment, Medication management, Plan inpatient treatment  and medications as below  Observation Level/Precautions:  15 minute checks  Laboratory:  as needed   Psychotherapy:  Milieu, group therapy  Medications:   Patient was restarted on Minipress and  Seroquel which he states he had been on in the past with good response, improved sleep , no side effects.  He agrees to antidepressant medication trial for depression and PTSD history. Agrees to Zoloft trial. Start Zoloft 50 mgrs QDAY. Continue Seroquel 50 mgrs QHS  Continue Minipress 1 mgr QHS   Consultations:  As needed   Discharge Concerns: -   Estimated LOS: 4 days   Other:     Physician Treatment Plan for Primary Diagnosis:  Depression, consider MDD Long Term Goal(s): Improvement in symptoms so as ready for discharge  Short Term Goals: Ability to identify changes in lifestyle to reduce recurrence of condition will improve and Ability to maintain clinical measurements within normal limits will improve  Physician Treatment Plan for Secondary Diagnosis: PTSD by history  Long Term Goal(s): Improvement in symptoms so as ready for discharge  Short Term Goals: Ability to identify changes in lifestyle to reduce recurrence of condition will improve and Ability to maintain clinical measurements within normal limits will improve  I certify that inpatient services furnished can reasonably be expected to improve the patient's condition.    Jenne Campus, MD 6/27/20194:56 PM

## 2017-10-17 NOTE — Progress Notes (Signed)
Nursing Progress Note: 7p-7a D: Pt currently presents with a depressed/guarded/evasive affect and behavior. Pt states "I am only concerned with leaving this place." Interacting minimally with the milieu. Pt reports fair sleep during the previous night with current medication regimen.   A: Pt provided with medications per providers orders. Pt's labs and vitals were monitored throughout the night. Pt supported emotionally and encouraged to express concerns and questions. Pt educated on medications.  R: Pt's safety ensured with 15 minute and environmental checks. Pt currently denies SI, HI, and AVH. Pt verbally contracts to seek staff if SI,HI, or AVH occurs and to consult with staff before acting on any harmful thoughts. Will continue to monitor.

## 2017-10-18 LAB — CBC WITH DIFFERENTIAL/PLATELET
Basophils Absolute: 0 10*3/uL (ref 0.0–0.1)
Basophils Relative: 0 %
EOS PCT: 0 %
Eosinophils Absolute: 0 10*3/uL (ref 0.0–0.7)
HEMATOCRIT: 45.2 % (ref 39.0–52.0)
HEMOGLOBIN: 15.5 g/dL (ref 13.0–17.0)
LYMPHS ABS: 1.6 10*3/uL (ref 0.7–4.0)
LYMPHS PCT: 7 %
MCH: 29.5 pg (ref 26.0–34.0)
MCHC: 34.3 g/dL (ref 30.0–36.0)
MCV: 86.1 fL (ref 78.0–100.0)
Monocytes Absolute: 2.3 10*3/uL — ABNORMAL HIGH (ref 0.1–1.0)
Monocytes Relative: 10 %
NEUTROS ABS: 18.5 10*3/uL — AB (ref 1.7–7.7)
Neutrophils Relative %: 83 %
Platelets: 263 10*3/uL (ref 150–400)
RBC: 5.25 MIL/uL (ref 4.22–5.81)
RDW: 12.2 % (ref 11.5–15.5)
WBC: 22.4 10*3/uL — AB (ref 4.0–10.5)

## 2017-10-18 LAB — SEDIMENTATION RATE: Sed Rate: 5 mm/hr (ref 0–16)

## 2017-10-18 NOTE — Progress Notes (Signed)
The focus of this group is to help patients review their daily goal of treatment and discuss progress on daily workbooks. Pt attended the evening group session and responded to all discussion prompts from the Writer. Pt shared that today was a good day on the unit, the highlight of which having a good talk with the doctor.  Pt mentioned being especially eager to discharge home. When asked for one thing he would do differently upon discharge to stay well, Pt replied: "I need to communicate more and not keep everything to myself."  Pt reported having no additional needs from Nursing Staff this evening and his affect was appropriate.

## 2017-10-18 NOTE — Progress Notes (Addendum)
[facility] MD Progress Note  10/18/2017 1:06 PM James Meadows  MRN:  [id number] Subjective: Patient reports "I am feeling better".  Denies medication side effects.  Reiterates his goal for this admission is to "get back on medications".  Patient has been noted to be febrile-denies headache, denies coughing, no shortness of breath, no vomiting, no diarrhea, no rash, denies chills or generalized malaise or myalgias.  Does endorse mild " sore throat", sensation of postnasal drip.  Objective : I have discussed case with treatment team and have met with patient. 27 year old single male who lives with his girlfriend, admitted voluntarily due to depression.  Reports a prior girlfriend committed suicide in 2014 and since then has had some PTSD and depressive symptoms which wax and wane and attempt worsening during anniversaries.  States "I just needed to get back on medications, that's all", and states " I am feeling all right".  Currently minimizes depression or significant neurovegetative symptoms, denies suicidal ideations. No disruptive behaviors on unit, limited milieu participation at this time, pleasant on approach. Labs reviewed. 6/26 WBC 8.3, Differential within normal.  Principal Problem: depression,PTSD  Diagnosis:   Patient Active Problem List   Diagnosis Date Noted  . MDD (major depressive disorder), recurrent severe, without psychosis ([place]) [F33.2] 10/16/2017  . Suicidal ideation [R45.851]   . Overdose [T50.901A]   . Major depressive disorder, recurrent severe without psychotic features ([place]) [F33.2] 04/29/2015  . PTSD (post-traumatic stress disorder) [F43.10] 04/29/2015  . Opioid dependence ([place]) [F11.20] 04/29/2015  . Marijuana abuse [F12.10] 08/22/2012  . ADHD (attention deficit hyperactivity disorder) [F90.9] 08/22/2012  . Episodic mood disorder ([place]) [F39] 08/22/2012   Total Time spent with patient: 20 minutes  Past Psychiatric History:   Past Medical History:  Past Medical History:   Diagnosis Date  . Depression   . Metacarpal bone fracture 07/2017   right small    Past Surgical History:  Procedure Laterality Date  . NO PAST SURGERIES    . OPEN REDUCTION INTERNAL FIXATION (ORIF) METACARPAL Right 08/08/2017   Procedure: OPEN REDUCTION INTERNAL FIXATION (ORIF) RIGHT SMALL METACARPAL;  Surgeon: Leanora Cover, MD;  Location: [facility];  Service: Orthopedics;  Laterality: Right;  . ORIF METACARPAL FRACTURE     Family History: History reviewed. No pertinent family history. Family Psychiatric  History:  Social History:  Social History   Substance and Sexual Activity  Alcohol Use Not Currently  . Frequency: Never     Social History   Substance and Sexual Activity  Drug Use Yes  . Types: Marijuana   Comment: daily    Social History   Socioeconomic History  . Marital status: Significant Other    Spouse name: Not on file  . Number of children: Not on file  . Years of education: Not on file  . Highest education level: Not on file  Occupational History  . Not on file  Social Needs  . Financial resource strain: Not on file  . Food insecurity:    Worry: Not on file    Inability: Not on file  . Transportation needs:    Medical: Not on file    Non-medical: Not on file  Tobacco Use  . Smoking status: Current Some Day Smoker    Types: Cigarettes  . Smokeless tobacco: Never Used  . Tobacco comment: 2 cig./day  Substance and Sexual Activity  . Alcohol use: Not Currently    Frequency: Never  . Drug use: Yes    Types: Marijuana  Comment: daily  . Sexual activity: Yes  Lifestyle  . Physical activity:    Days per week: Not on file    Minutes per session: Not on file  . Stress: Not on file  Relationships  . Social connections:    Talks on phone: Not on file    Gets together: Not on file    Attends religious service: Not on file    Active member of club or organization: Not on file    Attends meetings of clubs or organizations: Not on  file    Relationship status: Not on file  Other Topics Concern  . Not on file  Social History Narrative  . Not on file   Additional Social History:   Sleep: Improving  Appetite:  Improving  Current Medications: Current Facility-Administered Medications  Medication Dose Route Frequency Provider Last Rate Last Dose  . hydrOXYzine (ATARAX/VISTARIL) tablet 25 mg  25 mg Oral Q6H PRN Roque Schill A, MD      . prazosin (MINIPRESS) capsule 1 mg  1 mg Oral QHS Rankin, Shuvon B, NP   1 mg at 10/17/17 2121  . QUEtiapine (SEROQUEL) tablet 50 mg  50 mg Oral QHS Vernadette Stutsman, Myer Peer, MD   50 mg at 10/17/17 2121  . sertraline (ZOLOFT) tablet 50 mg  50 mg Oral Daily Ashlee Player, Myer Peer, MD   50 mg at 10/18/17 1191    Lab Results:  Results for orders placed or performed during the hospital encounter of 10/16/17 (from the past 48 hour(s))  Comprehensive metabolic panel     Status: Abnormal   Collection Time: 10/16/17  1:28 PM  Result Value Ref Range   Sodium 141 135 - 145 mmol/L   Potassium 4.1 3.5 - 5.1 mmol/L   Chloride 104 98 - 111 mmol/L    Comment: Please note change in reference range.   CO2 29 22 - 32 mmol/L   Glucose, Bld 94 70 - 99 mg/dL    Comment: Please note change in reference range.   BUN 17 6 - 20 mg/dL    Comment: Please note change in reference range.   Creatinine, Ser 1.03 0.61 - 1.24 mg/dL   Calcium 9.4 8.9 - 10.3 mg/dL   Total Protein 8.3 (H) 6.5 - 8.1 g/dL   Albumin 4.7 3.5 - 5.0 g/dL   AST 23 15 - 41 U/L   ALT 15 0 - 44 U/L    Comment: Please note change in reference range.   Alkaline Phosphatase 85 38 - 126 U/L   Total Bilirubin 1.1 0.3 - 1.2 mg/dL   GFR calc non Af Amer >60 >60 mL/min   GFR calc Af Amer >60 >60 mL/min    Comment: (NOTE) The eGFR has been calculated using the CKD EPI equation. This calculation has not been validated in all clinical situations. eGFR's persistently <60 mL/min signify possible Chronic Kidney Disease.    Anion gap 8 5 - 15     Comment: Performed at [facility], [address], [place], [place] [address]  Ethanol     Status: None   Collection Time: 10/16/17  1:28 PM  Result Value Ref Range   Alcohol, Ethyl (B) <10 <10 mg/dL    Comment: (NOTE) Lowest detectable limit for serum alcohol is 10 mg/dL. For medical purposes only. Performed at [facility], [address] [place], [place] [address]   Salicylate level     Status: None   Collection Time: 10/16/17  1:28 PM  Result Value  Ref Range   Salicylate Lvl <7.0 2.8 - 30.0 mg/dL    Comment: Performed at [facility], [address], [place], [place] [address]  Acetaminophen level     Status: Abnormal   Collection Time: 10/16/17  1:28 PM  Result Value Ref Range   Acetaminophen (Tylenol), Serum <10 (L) 10 - 30 ug/mL    Comment: (NOTE) Therapeutic concentrations vary significantly. A range of 10-30 ug/mL  may be an effective concentration for many patients. However, some  are best treated at concentrations outside of this range. Acetaminophen concentrations >150 ug/mL at 4 hours after ingestion  and >50 ug/mL at 12 hours after ingestion are often associated with  toxic reactions. Performed at [facility], [address] [place], [place] [address]   cbc     Status: None   Collection Time: 10/16/17  1:28 PM  Result Value Ref Range   WBC 8.3 4.0 - 10.5 K/uL   RBC 5.32 4.22 - 5.81 MIL/uL   Hemoglobin 15.9 13.0 - 17.0 g/dL   HCT 46.8 39.0 - 52.0 %   MCV 88.0 78.0 - 100.0 fL   MCH 29.9 26.0 - 34.0 pg   MCHC 34.0 30.0 - 36.0 g/dL   RDW 12.2 11.5 - 15.5 %   Platelets 272 150 - 400 K/uL    Comment: Performed at [facility], [address], [place], [place] [address]    Blood Alcohol level:  Lab Results  Component Value Date   ETH <10 10/16/2017   ETH <5 69/67/8938    Metabolic Disorder Labs: No results found for: HGBA1C, MPG No results found for: PROLACTIN No results found for: CHOL, TRIG, HDL, CHOLHDL, VLDL, LDLCALC  Physical Findings: AIMS: Facial  and Oral Movements Muscles of Facial Expression: None, normal Lips and Perioral Area: None, normal Jaw: None, normal Tongue: None, normal,Extremity Movements Upper (arms, wrists, hands, fingers): None, normal Lower (legs, knees, ankles, toes): None, normal, Trunk Movements Neck, shoulders, hips: None, normal, Overall Severity Severity of abnormal movements (highest score from questions above): None, normal Incapacitation due to abnormal movements: None, normal Patient's awareness of abnormal movements (rate only patient's report): No Awareness, Dental Status Current problems with teeth and/or dentures?: No Does patient usually wear dentures?: No  CIWA:    COWS:     Musculoskeletal: Strength & Muscle Tone: within normal limits Gait & Station: normal Patient leans: N/A  Psychiatric Specialty Exam: Physical Exam  ROS denies headache, chest pain, shortness of breath, no nausea, no vomiting, no diarrhea, no chills, no malaise, no myalgias, endorses mild odynophagia.  Blood pressure 135/64, pulse (!) 106, temperature (!) 100.4 F (38 C), temperature source Oral, resp. rate 18, height '5\' 8"'$  (1.727 m), weight 62.1 kg (137 lb).Body mass index is 20.83 kg/m.  General Appearance: Fairly Groomed  Eye Contact:  Good  Speech:  Normal Rate  Volume:  Normal  Mood:  Reports his mood is improved and currently minimizes depression  Affect:  Initially vaguely irritable, improves during session, brightens significantly, smiles at times appropriately  Thought Process:  Linear and Descriptions of Associations: Intact  Orientation:  Full (Time, Place, and Person)  Thought Content:  No hallucinations, no delusions expressed  Suicidal Thoughts:  No-denies any suicidal or self-injurious ideations, contracts for safety, no homicidal or violent ideations  Homicidal Thoughts:  No  Memory:  Recent and remote grossly intact  Judgement:  Other:  Fair/improving  Insight:  Fair/improving  Psychomotor Activity:   Normal  Concentration:  Concentration: Good and Attention  Span: Good  Recall:  Good  Fund of Knowledge:  Good  Language:  Good  Akathisia:  Negative  Handed:  Right  AIMS (if indicated):     Assets:  Communication Skills Desire for Improvement Resilience  ADL's:  Intact  Cognition:  WNL  Sleep:  Number of Hours: 6.75   Assessment-patient reports he is feeling better, currently minimizes depression, denies suicidal ideations, denies significant neurovegetative symptoms, is tolerating medications well.  He is hopeful for discharge soon.  Of note presented with fever earlier today, does not appear toxic or prostrated, denies symptoms other than mild odynophagia.   Treatment Plan Summary: Daily contact with patient to assess and evaluate symptoms and progress in treatment, Medication management, Plan Inpatient treatment and Medications as below Encourage group and milieu participation to work on coping skills and symptom reduction Continue Vistaril 25 mg every 6 hours PRN for anxiety Continue Seroquel 50 mg nightly for mood disorder and insomnia Continue Minipress 1 mg nightly for PTSD/nightmares Continue Zoloft 50 mg daily for depression/PTSD We will continue to monitor for symptoms of upper respiratory infection, monitor temperature, encourage p.o. Fluids.  Will order CBC with platelets and differential Jenne Campus, MD 10/18/2017, 1:06 PM

## 2017-10-18 NOTE — Progress Notes (Signed)
Recreation Therapy Notes  Date: 6.28.19 Time: 0930 Location: [address] Dayroom  Group Topic: Stress Management  Goal Area(s) Addresses:  Patient will verbalize importance of using healthy stress management.  Patient will identify positive emotions associated with healthy stress management.   Intervention: Stress Management  Activity :  Mountain Meditation.  LRT introduced the stress management technique of meditation.  LRT read a script on how mountains stay the same in spite of the things that go on around them.  Education:  Stress Management, Discharge Planning.   Education Outcome: Acknowledges edcuation/In group clarification offered/Needs additional education  Clinical Observations/Feedback: Pt did not attend group.    Johnrobert Foti, LRT/CTRS         Kadin Bera A 10/18/2017 12:22 PM 

## 2017-10-18 NOTE — [facility] Suicide Risk Assessment (Signed)
[facility] INPATIENT:  Family/Significant Other Suicide Prevention Education  Suicide Prevention Education:  Education Completed; Gaynelle AduCamra White, girlfriend [phone]) has been identified by the patient as the family member/significant other with whom the patient will be residing, and identified as the person(s) who will aid the patient in the event of a mental health crisis (suicidal ideations/suicide attempt).  With written consent from the patient, the family member/significant other has been provided the following suicide prevention education, prior to the and/or following the discharge of the patient.  The suicide prevention education provided includes the following:  Suicide risk factors  Suicide prevention and interventions  National Suicide Hotline telephone number  [facility] assessment telephone number  [facility] Emergency Assistance 911  [facility] and/or Residential Mobile Crisis Unit telephone number  Request made of family/significant other to:  Remove weapons (e.g., guns, rifles, knives), all items previously/currently identified as safety concern.    Remove drugs/medications (over-the-counter, prescriptions, illicit drugs), all items previously/currently identified as a safety concern.  The family member/significant other verbalizes understanding of the suicide prevention education information provided.  The family member/significant other agrees to remove the items of safety concern listed above.  Maeola SarahJolan E Ryenne Lynam 10/18/2017, 3:47 PM

## 2017-10-18 NOTE — Plan of Care (Signed)
  Problem: Education: Goal: Knowledge of [place] [facility] Education information/materials will improve Outcome: Not Progressing   

## 2017-10-18 NOTE — Progress Notes (Signed)
Writer spoke with patient 1:1 and he reports that his day has been fine except for the medication he takes in the morning makes him feel bad. He reports that it may be causing him to have a temperature. Writer encouraged him to speak to his doctor in the am concerning this medication. He is hopeful to discharge on tomorrow because his 684 yr old son's birthday is tomorrow. He was given gatorade and encouraged to drink plenty of fluids. Writer informed him of his medications. He reports that his goal is to stay on his medications once discharged. Safety maintained on unit with 15 min checks.

## 2017-10-18 NOTE — Tx Team (Signed)
Interdisciplinary Treatment and Diagnostic Plan Update  10/18/2017 Time of Session: 0910 James Meadows MRN: [id number]  Principal Diagnosis: <principal problem not specified>  Secondary Diagnoses: Active Problems:   MDD (major depressive disorder), recurrent severe, without psychosis (HCC)   Current Medications:  Current Facility-Administered Medications  Medication Dose Route Frequency Provider Last Rate Last Dose  . hydrOXYzine (ATARAX/VISTARIL) tablet 25 mg  25 mg Oral Q6H PRN Cobos, Fernando A, MD      . prazosin (MINIPRESS) capsule 1 mg  1 mg Oral QHS Rankin, Shuvon B, NP   1 mg at 10/17/17 2121  . QUEtiapine (SEROQUEL) tablet 50 mg  50 mg Oral QHS Cobos, Rockey Situ, MD   50 mg at 10/17/17 2121  . sertraline (ZOLOFT) tablet 50 mg  50 mg Oral Daily Cobos, Rockey Situ, MD   50 mg at 10/18/17 4098   PTA Medications: No medications prior to admission.    Patient Stressors: Medication change or noncompliance Other: flashbacks of dead girlfriend  Patient Strengths: Ability for insight Average or above average intelligence Capable of independent living Communication skills General fund of knowledge Motivation for treatment/growth Supportive family/friends  Treatment Modalities: Medication Management, Group therapy, Case management,  1 to 1 session with clinician, Psychoeducation, Recreational therapy.   Physician Treatment Plan for Primary Diagnosis: <principal problem not specified> Long Term Goal(s): Improvement in symptoms so as ready for discharge Improvement in symptoms so as ready for discharge   Short Term Goals: Ability to identify changes in lifestyle to reduce recurrence of condition will improve Ability to maintain clinical measurements within normal limits will improve Ability to identify changes in lifestyle to reduce recurrence of condition will improve Ability to maintain clinical measurements within normal limits will improve  Medication Management:  Evaluate patient's response, side effects, and tolerance of medication regimen.  Therapeutic Interventions: 1 to 1 sessions, Unit Group sessions and Medication administration.  Evaluation of Outcomes: Progressing  Physician Treatment Plan for Secondary Diagnosis: Active Problems:   MDD (major depressive disorder), recurrent severe, without psychosis (HCC)  Long Term Goal(s): Improvement in symptoms so as ready for discharge Improvement in symptoms so as ready for discharge   Short Term Goals: Ability to identify changes in lifestyle to reduce recurrence of condition will improve Ability to maintain clinical measurements within normal limits will improve Ability to identify changes in lifestyle to reduce recurrence of condition will improve Ability to maintain clinical measurements within normal limits will improve     Medication Management: Evaluate patient's response, side effects, and tolerance of medication regimen.  Therapeutic Interventions: 1 to 1 sessions, Unit Group sessions and Medication administration.  Evaluation of Outcomes: Progressing   RN Treatment Plan for Primary Diagnosis: <principal problem not specified> Long Term Goal(s): Knowledge of disease and therapeutic regimen to maintain health will improve  Short Term Goals: Ability to identify and develop effective coping behaviors will improve and Compliance with prescribed medications will improve  Medication Management: RN will administer medications as ordered by provider, will assess and evaluate patient's response and provide education to patient for prescribed medication. RN will report any adverse and/or side effects to prescribing provider.  Therapeutic Interventions: 1 on 1 counseling sessions, Psychoeducation, Medication administration, Evaluate responses to treatment, Monitor vital signs and CBGs as ordered, Perform/monitor CIWA, COWS, AIMS and Fall Risk screenings as ordered, Perform wound care treatments as  ordered.  Evaluation of Outcomes: Progressing   LCSW Treatment Plan for Primary Diagnosis: <principal problem not specified> Long Term Goal(s): Safe transition to  appropriate next level of care at discharge, Engage patient in therapeutic group addressing interpersonal concerns.  Short Term Goals: Engage patient in aftercare planning with referrals and resources, Increase social support and Increase skills for wellness and recovery  Therapeutic Interventions: Assess for all discharge needs, 1 to 1 time with Social worker, Explore available resources and support systems, Assess for adequacy in community support network, Educate family and significant other(s) on suicide prevention, Complete Psychosocial Assessment, Interpersonal group therapy.  Evaluation of Outcomes: Progressing   Progress in Treatment: Attending groups: Yes. Participating in groups: Yes. Taking medication as prescribed: Yes. Toleration medication: Yes. Family/Significant other contact made: Yes, individual(s) contacted:  girlfriend Patient understands diagnosis: Yes. Discussing patient identified problems/goals with staff: Yes. Medical problems stabilized or resolved: Yes. Denies suicidal/homicidal ideation: Yes. Issues/concerns per patient self-inventory: No. Other: none  New problem(s) identified: No, Describe:  none  New Short Term/Long Term Goal(s):  Patient Goals:    Discharge Plan or Barriers:   Reason for Continuation of Hospitalization: Depression Medication stabilization  Estimated Length of Stay: 3-5 days  Attendees: Patient: 10/18/2017   Physician: Dr Jama Flavorsobos, MD 10/18/2017   Nursing: Shelda JakesPatty Duke, RN 10/18/2017   RN Care Manager: 10/18/2017   Social Worker: Daleen SquibbGreg Breland Trouten, LCSW 10/18/2017   Recreational Therapist:  10/18/2017   Other:  10/18/2017   Other:  10/18/2017   Other: 10/18/2017      Scribe for Treatment Team: Lorri FrederickWierda, Cassandria Drew Jon, LCSW 10/18/2017 3:52 PM

## 2017-10-19 DIAGNOSIS — F332 Major depressive disorder, recurrent severe without psychotic features: Principal | ICD-10-CM

## 2017-10-19 DIAGNOSIS — J029 Acute pharyngitis, unspecified: Secondary | ICD-10-CM

## 2017-10-19 DIAGNOSIS — Z202 Contact with and (suspected) exposure to infections with a predominantly sexual mode of transmission: Secondary | ICD-10-CM

## 2017-10-19 LAB — RAPID URINE DRUG SCREEN, HOSP PERFORMED
Amphetamines: NOT DETECTED
Benzodiazepines: NOT DETECTED
Cocaine: NOT DETECTED
OPIATES: NOT DETECTED
TETRAHYDROCANNABINOL: POSITIVE — AB

## 2017-10-19 LAB — HEPATITIS C ANTIBODY (REFLEX)

## 2017-10-19 LAB — C-REACTIVE PROTEIN: CRP: 8.3 mg/dL — AB (ref <1.0)

## 2017-10-19 LAB — RPR: RPR Ser Ql: NONREACTIVE

## 2017-10-19 LAB — HCV COMMENT:

## 2017-10-19 LAB — HIV ANTIBODY (ROUTINE TESTING W REFLEX): HIV Screen 4th Generation wRfx: NONREACTIVE

## 2017-10-19 MED ORDER — CEFTRIAXONE SODIUM 1 G IJ SOLR
1.0000 g | Freq: Once | INTRAMUSCULAR | Status: AC
  Administered 2017-10-19: 1 g via INTRAMUSCULAR
  Filled 2017-10-19: qty 10

## 2017-10-19 MED ORDER — AMOXICILLIN 500 MG PO CAPS: 500.0000 mg | ORAL_CAPSULE | Freq: Three times a day (TID) | ORAL | Status: DC

## 2017-10-19 MED ORDER — AMOXICILLIN 500 MG PO CAPS
500.0000 mg | ORAL_CAPSULE | Freq: Three times a day (TID) | ORAL | Status: DC
  Filled 2017-10-19: qty 1

## 2017-10-19 MED ORDER — AMOXICILLIN 500 MG PO CAPS: 500.0000 mg | ORAL_CAPSULE | Freq: Three times a day (TID) | ORAL | 0 refills | Status: AC

## 2017-10-19 MED ORDER — PRAZOSIN HCL 1 MG PO CAPS: 1.0000 mg | ORAL_CAPSULE | Freq: Every day | ORAL | 0 refills | Status: AC

## 2017-10-19 MED ORDER — AZITHROMYCIN 250 MG PO TABS
1000.0000 mg | ORAL_TABLET | Freq: Once | ORAL | Status: AC
  Administered 2017-10-19: 1000 mg via ORAL
  Filled 2017-10-19: qty 4

## 2017-10-19 MED ORDER — SERTRALINE HCL 50 MG PO TABS: 50.0000 mg | ORAL_TABLET | Freq: Every day | ORAL | 0 refills | Status: AC

## 2017-10-19 MED ORDER — QUETIAPINE FUMARATE 50 MG PO TABS: 50.0000 mg | ORAL_TABLET | Freq: Every day | ORAL | 0 refills | Status: AC

## 2017-10-19 MED ORDER — HYDROXYZINE HCL 25 MG PO TABS: 25.0000 mg | ORAL_TABLET | Freq: Four times a day (QID) | ORAL | 0 refills | Status: AC | PRN

## 2017-10-19 MED ORDER — AMOXICILLIN 500 MG PO CAPS
500.0000 mg | ORAL_CAPSULE | Freq: Three times a day (TID) | ORAL | Status: DC
  Administered 2017-10-19: 500 mg via ORAL
  Filled 2017-10-19 (×4): qty 1

## 2017-10-19 MED ORDER — SERTRALINE HCL 50 MG PO TABS
50.0000 mg | ORAL_TABLET | Freq: Every day | ORAL | Status: DC
  Administered 2017-10-19: 50 mg via ORAL
  Filled 2017-10-19: qty 7
  Filled 2017-10-19: qty 1
  Filled 2017-10-19: qty 7
  Filled 2017-10-19: qty 1

## 2017-10-19 NOTE — Progress Notes (Signed)
  [facility] :  Will you be returning to the same living situation after discharge:  Yes,  girlfriend At discharge, do you have transportation home?: Yes,  arranged by patient Do you have the ability to pay for your medications: Yes,  denies barriers  Release of information consent forms completed and turned in to Medical Records by CSW.   Patient to Follow up at: Follow-up Information    Services, [facility]. Go on 10/22/2017.   Why:  Appointment for medication management and therapy services is Tuesday, 10/22/17 at 8:30am. Please be sure to bring your Photo ID, SSN, any insurance/income information and any discharge paperwork from this hospitalization.  Contact information: [address] [place] [id number] [phone]           Next level of care provider has access to [facility] Link:no  Safety Planning and Suicide Prevention discussed: Yes,  with girlfriend  Have you used any form of tobacco in the last 30 days? (Cigarettes, Smokeless Tobacco, Cigars, and/or Pipes): Yes  Has patient been referred to the Quitline?: Patient refused referral  Patient has been referred for addiction treatment: Yes  Lynnell ChadMareida J Grossman-Orr, LCSW 10/19/2017, 9:56 AM

## 2017-10-19 NOTE — Progress Notes (Signed)
Adult Psychoeducational Group Note  Date:  10/19/2017 Time:  10:50 AM  Group Topic/Focus:  Goals Group:   The focus of this group is to help patients establish daily goals to achieve during treatment and discuss how the patient can incorporate goal setting into their daily lives to aide in recovery.  Participation Level:  Did Not Attend  Additional Comments:  Pt was meeting with the doctor when group began and did not attend group.  Pt shared with this staff earlier that he wanted to discharge because it was his child's birthday.  James Meadows, James Meadows  MHT/LRT/CTRS 10/19/2017, 10:50 AM

## 2017-10-19 NOTE — Progress Notes (Signed)
Pt discharged to lobby. Pt was stable and appreciative at that time. All papers, prescriptions and medication samples were given. Pt had no valuables to be returned. Reviewed follow up and medications with pt. Verbal understanding expressed. Denies SI/HI and A/VH. Pt given opportunity to express concerns and ask questions.

## 2017-10-19 NOTE — Progress Notes (Signed)
Medical Consultation   James PurpuraRaheem O Meadows  ION:[id number]  DOB: 07/22/1990   DOA: 10/16/2017 PCP: Patient, No Pcp Per   Requesting physician: Dr. Rosezella Rumpfobbos  Reason for consultation: Fever and elevated WBC  History of Present Illness: James Meadows is an 27 y.o. male with no significant medical history other than STDs in the past and depression who is admitted at the behavioral health center for severe depression.  Medical consultation has been requested due to fever and elevated white count.  Patient report sore throat for 2 days which has improved at this time, however have some pain with swallowing.  Associated symptoms include rhinorrhea and postnasal drip.  Son had strep throat this past week.  He denies cough, chest pain, shortness of breath, rash, nausea, vomiting and diarrhea.  No ear pain, headache or dizziness.  History of STDs chlamydia and gonorrhea in the past.  Denies any discharge at this time.  No burning with urination.   Review of Systems:  As noted on HPI and all other reviewed are negative  Past Medical History: Past Medical History:  Diagnosis Date  . Depression   . Metacarpal bone fracture 07/2017   right small    Past Surgical History: Past Surgical History:  Procedure Laterality Date  . NO PAST SURGERIES    . OPEN REDUCTION INTERNAL FIXATION (ORIF) METACARPAL Right 08/08/2017   Procedure: OPEN REDUCTION INTERNAL FIXATION (ORIF) RIGHT SMALL METACARPAL;  Surgeon: Betha LoaKuzma, Kevin, MD;  Location: [facility];  Service: Orthopedics;  Laterality: Right;  . ORIF METACARPAL FRACTURE      Allergies:   Allergies  Allergen Reactions  . Latex Itching  . Oxycodone Rash and Other (See Comments)    FEELS LIKE SKIN IS BURNING     Social History:  reports that he has been smoking cigarettes.  He has never used smokeless tobacco. He reports that he drank alcohol. He reports that he has current or past drug history. Drug: Marijuana.   Family  History: History reviewed. No pertinent family history.    Physical Exam: Vitals:   10/18/17 1209 10/18/17 2121 10/19/17 0700 10/19/17 0821  BP:  (!) 143/76 139/88 130/78  Pulse:  84  99  Resp:    18  Temp: (!) 100.4 F (38 C)  97.8 F (36.6 C)   TempSrc: Oral  Oral   Weight:      Height:        Constitutional: Alert and awake, oriented x3, not in any acute distress. Eyes: PERLA, EOMI, irises appear normal, anicteric sclera,  ENMT: external ears and nose appear normal, normal hearing. OP erythema with tonsillar exudates bilaterally more prominent on the left side. Neck: Left enlarged submandibular lymph node neck appears normal, no masses, normal ROM, no thyromegaly, no JVD  CVS: S1-S2 clear, no murmur rubs or gallops, no LE edema, normal pedal pulses  Respiratory:  clear to auscultation bilaterally, no wheezing, rales or rhonchi. Respiratory effort normal.  Abdomen: soft nontender, nondistended, normal bowel sounds, no hepatosplenomegaly, no hernias  Musculoskeletal: : no cyanosis, clubbing or edema noted bilaterally Psych: judgement and insight appear normal, stable mood and affect, mental status Skin: no rashes or lesions or ulcers, no induration or nodules   Data reviewed:  I have personally reviewed following labs and imaging studies Labs:  CBC: Recent Labs  Lab 10/16/17 1328 10/18/17 1645  WBC 8.3 22.4*  NEUTROABS  --  18.5*  HGB  15.9 15.5  HCT 46.8 45.2  MCV 88.0 86.1  PLT 272 263    Basic Metabolic Panel: Recent Labs  Lab 10/16/17 1328  NA 141  K 4.1  CL 104  CO2 29  GLUCOSE 94  BUN 17  CREATININE 1.03  CALCIUM 9.4   GFR Estimated Creatinine Clearance: 95.5 mL/min (by C-G formula based on SCr of 1.03 mg/dL). Liver Function Tests: Recent Labs  Lab 10/16/17 1328  AST 23  ALT 15  ALKPHOS 85  BILITOT 1.1  PROT 8.3*  ALBUMIN 4.7   No results for input(s): LIPASE, AMYLASE in the last 168 hours. No results for input(s): AMMONIA in the last 168  hours. Coagulation profile No results for input(s): INR, PROTIME in the last 168 hours.  Cardiac Enzymes: No results for input(s): CKTOTAL, CKMB, CKMBINDEX, TROPONINI in the last 168 hours. BNP: Invalid input(s): POCBNP CBG: No results for input(s): GLUCAP in the last 168 hours. D-Dimer No results for input(s): DDIMER in the last 72 hours. Hgb A1c No results for input(s): HGBA1C in the last 72 hours. Lipid Profile No results for input(s): CHOL, HDL, LDLCALC, TRIG, CHOLHDL, LDLDIRECT in the last 72 hours. Thyroid function studies No results for input(s): TSH, T4TOTAL, T3FREE, THYROIDAB in the last 72 hours.  Invalid input(s): FREET3 Anemia work up No results for input(s): VITAMINB12, FOLATE, FERRITIN, TIBC, IRON, RETICCTPCT in the last 72 hours. Urinalysis    Component Value Date/Time   COLORURINE YELLOW 05/05/2017 2120   APPEARANCEUR CLEAR 05/05/2017 2120   LABSPEC 1.042 (H) 05/05/2017 2120   PHURINE 7.0 05/05/2017 2120   GLUCOSEU NEGATIVE 05/05/2017 2120   HGBUR NEGATIVE 05/05/2017 2120   BILIRUBINUR NEGATIVE 05/05/2017 2120   KETONESUR 5 (A) 05/05/2017 2120   PROTEINUR NEGATIVE 05/05/2017 2120   UROBILINOGEN 0.2 07/05/2014 1025   NITRITE NEGATIVE 05/05/2017 2120   LEUKOCYTESUR NEGATIVE 05/05/2017 2120     Microbiology No results found for this or any previous visit (from the past 240 hour(s)).    Inpatient Medications:   Scheduled Meds: . amoxicillin  500 mg Oral Q8H  . prazosin  1 mg Oral QHS  . QUEtiapine  50 mg Oral QHS   Continuous Infusions:   Radiological Exams on Admission: No results found.  Impression/Recommendations MDD (major depressive disorder) Management per psychiatry  Pharyngitis Felt to be bacterial, strep throat Centor score 4.  Causing fever and elevated WBC Will treat empirically with amoxicillin 500 mg 3 times daily for 10 days. Follow-up with PCP within 7-10  STD Patient not using condoms, have history of multiple positive  results for chlamydia and gonorrhea.  He is concerned that might have it again, however no discharge.  Will treat empirically given hx with 1 dose of Rocephin and azithromycin.  GC and Chlamydia urine sent.  Safe sex discussed.  Thank you for this consultation.  Patient medically appropriate for outpatient treatment, medical team will sign off.  Time Spent: 55 minutes.   Latrelle Dodrill M.D. [facility]  10/19/2017, 9:02 AM  Pager please text page via  [email or web address]  Note - This record has been created using [employer]. Chart creation errors have been sought, but may not always have been located. Such creation errors do not reflect on the standard of medical care.

## 2017-10-19 NOTE — Progress Notes (Signed)
D. Pt awake resting in bed- reports being warmer in his room. Pt presents with a flat affect that brightens during interactions and calm, cooperative behavior. Pt reports wanting to go home for his son's birthday  Pt currently denies SI/HI and A/V hallucinations A. Labs and vitals monitored. Pt supported emotionally and encouraged to express concerns and ask questions.   R. Pt remains safe with 15 minute checks. Will continue POC.

## 2017-10-19 NOTE — Discharge Summary (Addendum)
Physician Discharge Summary Note  Patient:  James Meadows is an 27 y.o., male MRN:  [id number] DOB:  05-14-1990 Patient phone:  [phone] (home)  Patient address:   [address] [place] [address],  Total Time spent with patient: 20 minutes  Date of Admission:  10/16/2017 Date of Discharge: 10/19/17  Reason for Admission:  Worsening depression with SI  Principal Problem: MDD (major depressive disorder), recurrent severe, without psychosis [facility]) Discharge Diagnoses: Patient Active Problem List   Diagnosis Date Noted  . MDD (major depressive disorder), recurrent severe, without psychosis (HCC) [F33.2] 10/16/2017  . Suicidal ideation [R45.851]   . Overdose [T50.901A]   . Major depressive disorder, recurrent severe without psychotic features (HCC) [F33.2] 04/29/2015  . PTSD (post-traumatic stress disorder) [F43.10] 04/29/2015  . Opioid dependence (HCC) [F11.20] 04/29/2015  . Marijuana abuse [F12.10] 08/22/2012  . ADHD (attention deficit hyperactivity disorder) [F90.9] 08/22/2012  . Episodic mood disorder (HCC) [F39] 08/22/2012    Past Psychiatric History: one prior psychiatric admission in 2017 related to depression/ suicide attempt by overdose. He denies any history of self cutting or self injurious behaviors, Denies history of violence, denies history of psychosis, denies history of mania or hypomania. Denies panic attacks but endorses some agoraphobia. Denies excessive worrying or other anxiety symptoms.  Past Medical History:  Past Medical History:  Diagnosis Date  . Depression   . Metacarpal bone fracture 07/2017   right small    Past Surgical History:  Procedure Laterality Date  . NO PAST SURGERIES    . OPEN REDUCTION INTERNAL FIXATION (ORIF) METACARPAL Right 08/08/2017   Procedure: OPEN REDUCTION INTERNAL FIXATION (ORIF) RIGHT SMALL METACARPAL;  Surgeon: Betha Loa, MD;  Location: [facility];  Service: Orthopedics;  Laterality: Right;  . ORIF  METACARPAL FRACTURE     Family History: History reviewed. No pertinent family history. Family Psychiatric  History: denies history of mental illness in family, no history of suicides in family, no history of drug or alcohol abuse in family.  Social History:  Social History   Substance and Sexual Activity  Alcohol Use Not Currently  . Frequency: Never     Social History   Substance and Sexual Activity  Drug Use Yes  . Types: Marijuana   Comment: daily    Social History   Socioeconomic History  . Marital status: Significant Other    Spouse name: Not on file  . Number of children: Not on file  . Years of education: Not on file  . Highest education level: Not on file  Occupational History  . Not on file  Social Needs  . Financial resource strain: Not on file  . Food insecurity:    Worry: Not on file    Inability: Not on file  . Transportation needs:    Medical: Not on file    Non-medical: Not on file  Tobacco Use  . Smoking status: Current Some Day Smoker    Types: Cigarettes  . Smokeless tobacco: Never Used  . Tobacco comment: 2 cig./day  Substance and Sexual Activity  . Alcohol use: Not Currently    Frequency: Never  . Drug use: Yes    Types: Marijuana    Comment: daily  . Sexual activity: Yes  Lifestyle  . Physical activity:    Days per week: Not on file    Minutes per session: Not on file  . Stress: Not on file  Relationships  . Social connections:    Talks on phone: Not on file  Gets together: Not on file    Attends religious service: Not on file    Active member of club or organization: Not on file    Attends meetings of clubs or organizations: Not on file    Relationship status: Not on file  Other Topics Concern  . Not on file  Social History Narrative  . Not on file    Hospital Course:   10/17/17 [facility] MD Assessment: 27 year old male, single, lives with GF. Presented to the ED voluntarily epristeride. He reports worsening depression over recent  days to weeks. States " it is not that bad but I want to get back on medications so it does not get worse". He denies suicidal ideations, but states " I don't want to let my depression get worse to where I do want to hurt myself ".He lost a GF to suicide in 2014, reports he found her, and has had PTSD and depression symptoms since then ,which wax and wane . States that this is around the time of her birthday  anniversary which has caused him to feel more depressed. States that he often feels worse around her birthday or death anniversaries . Endorses some depression but currently denies neuro-vegetative symptoms of depression and denies changes in appetite, sleep, energy level or any pervasive sense of anhedonia.  Patient remained on the [facility] unit for 2 days. The patient stabilized on medication and therapy. Patient was discharged on Zoloft 50 mg Daily, Seroquel 50 mg QHS, and Prazosin 1 mg QHS. Patient has shown improvement with improved mood, affect, sleep, appetite, and interaction. Patient has attended group and participated. Patient has been seen in the day room interacting with peers and staff appropriately. Patient denies any SI/HI/AVH and contracts for safety. Patient agrees to follow up at [facility] recovery services. Patient is provided with prescriptions for their medications upon discharge. Patient spiked a fever this morning and had elevated WBC. Dr. Jama Flavors did a hopitalist consult and Dr. Edward Jolly diagnosed patient with strep throat and prescribed antibiotics.   Physical Findings: AIMS: Facial and Oral Movements Muscles of Facial Expression: None, normal Lips and Perioral Area: None, normal Jaw: None, normal Tongue: None, normal,Extremity Movements Upper (arms, wrists, hands, fingers): None, normal Lower (legs, knees, ankles, toes): None, normal, Trunk Movements Neck, shoulders, hips: None, normal, Overall Severity Severity of abnormal movements (highest score from questions above): None,  normal Incapacitation due to abnormal movements: None, normal Patient's awareness of abnormal movements (rate only patient's report): No Awareness, Dental Status Current problems with teeth and/or dentures?: No Does patient usually wear dentures?: No  CIWA:    COWS:     Musculoskeletal: Strength & Muscle Tone: within normal limits Gait & Station: normal Patient leans: N/A  Psychiatric Specialty Exam: Physical Exam  Nursing note and vitals reviewed. Constitutional: He is oriented to person, place, and time. He appears well-developed and well-nourished.  Cardiovascular: Normal rate.  Respiratory: Effort normal.  Musculoskeletal: Normal range of motion.  Neurological: He is alert and oriented to person, place, and time.  Skin: Skin is warm.    Review of Systems  Constitutional: Negative.   HENT: Positive for sore throat.   Eyes: Negative.   Respiratory: Negative.   Cardiovascular: Negative.   Gastrointestinal: Negative.   Genitourinary: Negative.   Musculoskeletal: Negative.   Skin: Negative.   Neurological: Negative.   Endo/Heme/Allergies: Negative.   Psychiatric/Behavioral: Negative.     Blood pressure 130/78, pulse 99, temperature 97.8 F (36.6 C), temperature source Oral, resp. rate  18, height 5\' 8"  (1.727 m), weight 62.1 kg (137 lb).Body mass index is 20.83 kg/m.  General Appearance: Casual  Eye Contact:  Good  Speech:  Clear and Coherent and Normal Rate  Volume:  Normal  Mood:  Euthymic  Affect:  Congruent  Thought Process:  Goal Directed and Descriptions of Associations: Intact  Orientation:  Full (Time, Place, and Person)  Thought Content:  WDL  Suicidal Thoughts:  No  Homicidal Thoughts:  No  Memory:  Immediate;   Good Recent;   Good Remote;   Good  Judgement:  Fair  Insight:  Fair  Psychomotor Activity:  Normal  Concentration:  Concentration: Good and Attention Span: Good  Recall:  Good  Fund of Knowledge:  Good  Language:  Good  Akathisia:  No   Handed:  Right  AIMS (if indicated):     Assets:  Communication Skills Desire for Improvement Financial Resources/Insurance Housing Physical Health Social Support Transportation  ADL's:  Intact  Cognition:  WNL  Sleep:  Number of Hours: 6.75     Have you used any form of tobacco in the last 30 days? (Cigarettes, Smokeless Tobacco, Cigars, and/or Pipes): Yes  Has this patient used any form of tobacco in the last 30 days? (Cigarettes, Smokeless Tobacco, Cigars, and/or Pipes) Yes, Yes, A prescription for an FDA-approved tobacco cessation medication was offered at discharge and the patient refused  Blood Alcohol level:  Lab Results  Component Value Date   ETH <10 10/16/2017   ETH <5 04/29/2015    Metabolic Disorder Labs:  No results found for: HGBA1C, MPG No results found for: PROLACTIN No results found for: CHOL, TRIG, HDL, CHOLHDL, VLDL, LDLCALC  See Psychiatric Specialty Exam and Suicide Risk Assessment completed by Attending Physician prior to discharge.  Discharge destination:  Home  Is patient on multiple antipsychotic therapies at discharge:  No   Has Patient had three or more failed trials of antipsychotic monotherapy by history:  No  Recommended Plan for Multiple Antipsychotic Therapies: NA   Allergies as of 10/19/2017      Reactions   Latex Itching   Oxycodone Rash, Other (See Comments)   FEELS LIKE SKIN IS BURNING      Medication List    TAKE these medications     Indication  amoxicillin 500 MG capsule Commonly known as:  AMOXIL Take 1 capsule (500 mg total) by mouth 3 (three) times daily for 10 days.  Indication:  Strep Throat   hydrOXYzine 25 MG tablet Commonly known as:  ATARAX/VISTARIL Take 1 tablet (25 mg total) by mouth every 6 (six) hours as needed for anxiety.  Indication:  Feeling Anxious   prazosin 1 MG capsule Commonly known as:  MINIPRESS Take 1 capsule (1 mg total) by mouth at bedtime. PTSD nightmares  Indication:  Nightmares    QUEtiapine 50 MG tablet Commonly known as:  SEROQUEL Take 1 tablet (50 mg total) by mouth at bedtime. For mood control  Indication:  mood stability   sertraline 50 MG tablet Commonly known as:  ZOLOFT Take 1 tablet (50 mg total) by mouth daily. For mood control  Indication:  mood stability      Follow-up Information    Services, [facility]. Go on 10/22/2017.   Why:  Appointment for medication management and therapy services is Tuesday, 10/22/17 at 8:30am. Please be sure to bring your Photo ID, SSN, any insurance/income information and any discharge paperwork from this hospitalization.  Contact information: [address]  [place] [id number]  [phone]           Follow-up recommendations:  Continue activity as tolerated. Continue diet as recommended by your PCP. Ensure to keep all appointments with outpatient providers.  Comments:  Patient is instructed prior to discharge to: Take all medications as prescribed by his/her mental healthcare provider. Report any adverse effects and or reactions from the medicines to his/her outpatient provider promptly. Patient has been instructed & cautioned: To not engage in alcohol and or illegal drug use while on prescription medicines. In the event of worsening symptoms, patient is instructed to call the crisis hotline, 911 and or go to the nearest ED for appropriate evaluation and treatment of symptoms. To follow-up with his/her primary care provider for your other medical issues, concerns and or health care needs.    Signed: Gerlene Burdock Money, FNP 10/19/2017, 10:25 AM   Patient seen, Suicide Assessment Completed.  Disposition Plan Reviewed

## 2017-10-19 NOTE — [facility] Suicide Risk Assessment (Signed)
[facility]   Principal Problem: Depression Discharge Diagnoses:  Patient Active Problem List   Diagnosis Date Noted  . MDD (major depressive disorder), recurrent severe, without psychosis (HCC) [F33.2] 10/16/2017  . Suicidal ideation [R45.851]   . Overdose [T50.901A]   . Major depressive disorder, recurrent severe without psychotic features (HCC) [F33.2] 04/29/2015  . PTSD (post-traumatic stress disorder) [F43.10] 04/29/2015  . Opioid dependence (HCC) [F11.20] 04/29/2015  . Marijuana abuse [F12.10] 08/22/2012  . ADHD (attention deficit hyperactivity disorder) [F90.9] 08/22/2012  . Episodic mood disorder (HCC) [F39] 08/22/2012    Total Time spent with patient: 30 minutes  Musculoskeletal: Strength & Muscle Tone: within normal limits-of note, no rigidity, no cogwheeling, no EPS Gait & Station: normal Patient leans: N/A  Psychiatric Specialty Exam: ROS-no headache, mild odynophagia, no chest pain, no shortness of breath,no coughing, no vomiting, no nausea, no dysuria, no urgency, no rash or skin lesions, low-grade fever, no chills.  Blood pressure 130/78, pulse 99, temperature 97.8 F (36.6 C), temperature source Oral, resp. rate 18, height 5\' 8"  (1.727 m), weight 62.1 kg (137 lb).Body mass index is 20.83 kg/m.  General Appearance: Fairly Groomed  Patent attorneyye Contact::  Good  Speech:  Normal Rate409  Volume:  Normal  Mood:  Reports mood is improved, denies depression, states "I feel fine"  Affect:  Appropriate and Reactive  Thought Process:  Linear and Descriptions of Associations: Intact  Orientation:  Full (Time, Place, and Person)  Thought Content:  Denies hallucinations, no delusions are expressed, not internally preoccupied  Suicidal Thoughts:  No-denies any suicidal ideations, no self-injurious ideations  Homicidal Thoughts:  No- no homicidal or violent ideations  Memory:  Recent and remote grossly intact  Judgement:  Other:  Improving  Insight:   Fair/improving  Psychomotor Activity:  Normal  Concentration:  Good  Recall:  Good  Fund of Knowledge:Good  Language: Good  Akathisia:  Negative  Handed:  Right  AIMS (if indicated):     Assets:  Communication Skills Desire for Improvement Resilience  Sleep:  Number of Hours: 6.75  Cognition: WNL  ADL's:  Intact   Mental Status Per Nursing Assessment::   On Admission:  Suicidal ideation indicated by patient, Self-harm thoughts  Demographic Factors:  27 year old male, lives with girlfriend, currently unemployed  Loss Factors: Medication non compliance , loss of a prior GF to suicide .   Historical Factors: One prior psychiatric admission in 2017 for depression and suicide attempt, history of PTSD symptoms  Risk Reduction Factors:   Responsible for children under 27 years of age, Sense of responsibility to family and Living with another person, especially a relative  Continued Clinical Symptoms:  At this time patient is alert, attentive, less irritable and today presents calm, pleasant.  Denies depression, states his mood is "fine" , affect is more reactive.  No thought disorder, not suicidal, not homicidal, no violent ideations, no hallucinations, no delusions, not internally preoccupied, future oriented, expressing desire to be discharged in order to spend time with his son whose birthday is today. Patient reports he does not want to take Zoloft, as he felt that this medication might of caused some vague discomfort/nausea.  He plans to continue Seroquel and Minipress which he has tolerated well. Patient has presented with fever, odynophagia, and a CBC is positive for leukocytosis, shift.  He states he feels well, does not present toxic or prostrated, visible on the unit, smiling/laughing at times.  Hospitalist service has evaluated patient-bacterial pharyngitis suspected, recommendation is to  treat empirically with amoxicillin.   Cognitive Features That Contribute To Risk:  No  gross cognitive deficits noted upon discharge. Is alert , attentive, and oriented x 3    Suicide Risk:  Mild:  Suicidal ideation of limited frequency, intensity, duration, and specificity.  There are no identifiable plans, no associated intent, mild dysphoria and related symptoms, good self-control (both objective and subjective assessment), few other risk factors, and identifiable protective factors, including available and accessible social support.  Follow-up Information    Services, [facility]. Go on 10/22/2017.   Why:  Appointment for medication management and therapy services is Tuesday, 10/22/17 at 8:30am. Please be sure to bring your Photo ID, SSN, any insurance/income information and any discharge paperwork from this hospitalization.  Contact information: [address] [place] [address] [phone]           Plan Of Care/Follow-up recommendations:  Activity:  As tolerated Diet:  Regular Tests:  NA Other:  See below  Patient is requesting discharge, there are no current grounds for involuntary commitment, he is leaving unit in good spirits, plans to return home. Follow-up as above. Agrees to return to ED if symptoms worsen or fever persists.   Craige Cotta, MD 10/19/2017, 11:04 AM

## 2017-10-21 LAB — GC/CHLAMYDIA PROBE AMP (~~LOC~~) NOT AT ARMC
Chlamydia: POSITIVE — AB
NEISSERIA GONORRHEA: POSITIVE — AB

## 2017-11-04 ENCOUNTER — Telehealth (HOSPITAL_COMMUNITY): Payer: Self-pay

## 2017-11-04 NOTE — Telephone Encounter (Signed)
Results-Patient was not at home and no message left with male that answered the phone.

## 2017-11-24 ENCOUNTER — Encounter (HOSPITAL_COMMUNITY): Payer: Self-pay | Admitting: Emergency Medicine

## 2017-11-24 ENCOUNTER — Emergency Department (HOSPITAL_COMMUNITY): Payer: Medicaid Other

## 2017-11-24 ENCOUNTER — Emergency Department (HOSPITAL_COMMUNITY)
Admission: EM | Admit: 2017-11-24 | Discharge: 2017-11-24 | Disposition: A | Discharge status: Home / Self Care | Payer: Medicaid Other | Attending: Emergency Medicine | Admitting: Emergency Medicine

## 2017-11-24 ENCOUNTER — Other Ambulatory Visit: Payer: Self-pay

## 2017-11-24 DIAGNOSIS — Z79899 Other long term (current) drug therapy: Secondary | ICD-10-CM | POA: Diagnosis not present

## 2017-11-24 DIAGNOSIS — S93402A Sprain of unspecified ligament of left ankle, initial encounter: Secondary | ICD-10-CM | POA: Diagnosis not present

## 2017-11-24 DIAGNOSIS — Z720 Tobacco use: Secondary | ICD-10-CM | POA: Diagnosis not present

## 2017-11-24 DIAGNOSIS — Y92321 Football field as the place of occurrence of the external cause: Secondary | ICD-10-CM | POA: Insufficient documentation

## 2017-11-24 DIAGNOSIS — Y9361 Activity, american tackle football: Secondary | ICD-10-CM | POA: Diagnosis not present

## 2017-11-24 DIAGNOSIS — Y999 Unspecified external cause status: Secondary | ICD-10-CM | POA: Diagnosis not present

## 2017-11-24 DIAGNOSIS — X500XXA Overexertion from strenuous movement or load, initial encounter: Secondary | ICD-10-CM | POA: Insufficient documentation

## 2017-11-24 DIAGNOSIS — S99912A Unspecified injury of left ankle, initial encounter: Secondary | ICD-10-CM | POA: Diagnosis present

## 2017-11-24 MED ORDER — DICLOFENAC SODIUM 75 MG PO TBEC: 75.0000 mg | DELAYED_RELEASE_TABLET | Freq: Two times a day (BID) | ORAL | 0 refills | Status: AC

## 2017-11-24 NOTE — Discharge Instructions (Addendum)
Elevate and apply ice packs on/off to your ankle.  Use the crutches as needed for weight bearing.  Call Dr. Mort SawyersHarrison's office to arrange a follow-up appt in one week if not improving

## 2017-11-24 NOTE — ED Provider Notes (Addendum)
[facility] EMERGENCY DEPARTMENT Provider Note   CSN: [id number] Arrival date & time: 11/24/17  0935     History   Chief Complaint Chief Complaint  Patient presents with  . Ankle Pain    HPI Josh CASTLE LAMONS is a 27 y.o. male.  HPI   Challen BEAUREGARD JARRELLS is a 27 y.o. male who presents to the Emergency Department complaining of left-sided ankle pain secondary to a mechanical injury that occurred yesterday.  He states he "rolled" his ankle while playing football and then another player accidentally stepped on his foot.  He describes a constant throbbing pain to the medial and lateral ankle.  Pain is worse with attempted weightbearing.  He denies known swelling, numbness, pain proximal to the ankle, or other injuries.   Past Medical History:  Diagnosis Date  . Depression   . Metacarpal bone fracture 07/2017   right small    Patient Active Problem List   Diagnosis Date Noted  . MDD (major depressive disorder), recurrent severe, without psychosis (HCC) 10/16/2017  . Suicidal ideation   . Overdose   . Major depressive disorder, recurrent severe without psychotic features (HCC) 04/29/2015  . PTSD (post-traumatic stress disorder) 04/29/2015  . Opioid dependence (HCC) 04/29/2015  . Marijuana abuse 08/22/2012  . ADHD (attention deficit hyperactivity disorder) 08/22/2012  . Episodic mood disorder (HCC) 08/22/2012    Past Surgical History:  Procedure Laterality Date  . NO PAST SURGERIES    . OPEN REDUCTION INTERNAL FIXATION (ORIF) METACARPAL Right 08/08/2017   Procedure: OPEN REDUCTION INTERNAL FIXATION (ORIF) RIGHT SMALL METACARPAL;  Surgeon: Betha Loa, MD;  Location: [facility];  Service: Orthopedics;  Laterality: Right;  . ORIF METACARPAL FRACTURE          Home Medications    Prior to Admission medications   Medication Sig Start Date End Date Taking? Authorizing Provider  diclofenac (VOLTAREN) 75 MG EC tablet Take 1 tablet (75 mg total) by mouth 2 (two) times  daily. Take with food 11/24/17   Dacian Orrico, PA-C  hydrOXYzine (ATARAX/VISTARIL) 25 MG tablet Take 1 tablet (25 mg total) by mouth every 6 (six) hours as needed for anxiety. 10/19/17   Money, Gerlene Burdock, FNP  prazosin (MINIPRESS) 1 MG capsule Take 1 capsule (1 mg total) by mouth at bedtime. PTSD nightmares 10/19/17   Money, Gerlene Burdock, FNP  QUEtiapine (SEROQUEL) 50 MG tablet Take 1 tablet (50 mg total) by mouth at bedtime. For mood control 10/19/17   Money, Gerlene Burdock, FNP  sertraline (ZOLOFT) 50 MG tablet Take 1 tablet (50 mg total) by mouth daily. For mood control 10/19/17   Money, Gerlene Burdock, FNP    Family History No family history on file.  Social History Social History   Tobacco Use  . Smoking status: Current Some Day Smoker    Types: Cigarettes  . Smokeless tobacco: Never Used  . Tobacco comment: 2 cig./day  Substance Use Topics  . Alcohol use: Not Currently    Frequency: Never  . Drug use: Yes    Types: Marijuana    Comment: daily     Allergies   Latex and Oxycodone   Review of Systems Review of Systems  Constitutional: Negative for chills and fever.  Respiratory: Negative for shortness of breath.   Cardiovascular: Negative for chest pain.  Gastrointestinal: Negative for nausea and vomiting.  Musculoskeletal: Positive for arthralgias (Left ankle pain). Negative for back pain and joint swelling.  Skin: Negative for color change and wound.  Neurological:  Negative for dizziness, weakness and numbness.  All other systems reviewed and are negative.    Physical Exam Updated Vital Signs BP 132/86 (BP Location: Right Arm)   Pulse 79   Temp 98.2 F (36.8 C) (Oral)   Resp 18   Ht 5\' 8"  (1.727 m)   Wt 65.3 kg (144 lb)   SpO2 99%   BMI 21.90 kg/m   Physical Exam  Constitutional: He appears well-developed and well-nourished. No distress.  HENT:  Head: Normocephalic and atraumatic.  Cardiovascular: Normal rate, regular rhythm and intact distal pulses.  Pulmonary/Chest:  Effort normal and breath sounds normal.  Musculoskeletal: He exhibits tenderness. He exhibits no edema.  Diffuse tenderness to palpation of the medial and lateral ankle.  No obvious ligament instability,  No edema or ecchymosis.  No tenderness proximal to the ankle, compartments are soft.  Neurological: He is alert. No sensory deficit.  Skin: Skin is warm and dry. Capillary refill takes less than 2 seconds.  Nursing note and vitals reviewed.    ED Treatments / Results  Labs (all labs ordered are listed, but only abnormal results are displayed) Labs Reviewed - No data to display  EKG None  Radiology Dg Ankle Complete Left  Result Date: 11/24/2017 CLINICAL DATA:  Eversion injury yesterday with ankle pain, initial encounter EXAM: LEFT ANKLE COMPLETE - 3+ VIEW COMPARISON:  None. FINDINGS: There is no evidence of fracture, dislocation, or joint effusion. There is no evidence of arthropathy or other focal bone abnormality. Soft tissues are unremarkable. IMPRESSION: No acute abnormality noted. Electronically Signed   By: Alcide CleverMark  Lukens M.D.   On: 11/24/2017 10:20    Procedures Procedures (including critical care time)  Medications Ordered in ED Medications - No data to display   Initial Impression / Assessment and Plan / ED Course  I have reviewed the triage vital signs and the nursing notes.  Pertinent labs & imaging results that were available during my care of the patient were reviewed by me and considered in my medical decision making (see chart for details).     Patient with mechanical injury of the left ankle.  X-ray negative for fracture.  Neurovascularly intact.  Discussed x-ray findings with patient.  Likely sprain.  He agrees to treatment plan with RICE therapy, NSAID and close orthopedic follow-up in 1 week if not improving.  ASO applied and crutches given.   Final Clinical Impressions(s) / ED Diagnoses   Final diagnoses:  Sprain of left ankle, unspecified ligament,  initial encounter    ED Discharge Orders        Ordered    diclofenac (VOLTAREN) 75 MG EC tablet  2 times daily     11/24/17 1040       Lavonia Eager, Mindenammy, PA-C 11/24/17 1648    Mavis Fichera, Babette Relicammy, PA-C 11/24/17 1649    Samuel JesterMcManus, Kathleen, DO 11/26/17 1604

## 2017-11-24 NOTE — ED Triage Notes (Signed)
Patient c/o left ankle pain. Per patient rolled ankle yesterday while playing football. Patient states someone stepped on foot when he rolled his ankle.

## 2018-02-17 ENCOUNTER — Emergency Department (HOSPITAL_COMMUNITY)
Admission: EM | Admit: 2018-02-17 | Discharge: 2018-02-17 | Disposition: A | Discharge status: Home / Self Care | Payer: Medicaid Other | Attending: Emergency Medicine | Admitting: Emergency Medicine

## 2018-02-17 ENCOUNTER — Encounter (HOSPITAL_COMMUNITY): Payer: Self-pay | Admitting: Emergency Medicine

## 2018-02-17 ENCOUNTER — Other Ambulatory Visit: Payer: Self-pay

## 2018-02-17 DIAGNOSIS — Z79899 Other long term (current) drug therapy: Secondary | ICD-10-CM | POA: Diagnosis not present

## 2018-02-17 DIAGNOSIS — F17210 Nicotine dependence, cigarettes, uncomplicated: Secondary | ICD-10-CM | POA: Diagnosis not present

## 2018-02-17 DIAGNOSIS — Z202 Contact with and (suspected) exposure to infections with a predominantly sexual mode of transmission: Secondary | ICD-10-CM

## 2018-02-17 LAB — URINALYSIS, ROUTINE W REFLEX MICROSCOPIC
BILIRUBIN URINE: NEGATIVE
Bacteria, UA: NONE SEEN
Glucose, UA: NEGATIVE mg/dL
KETONES UR: NEGATIVE mg/dL
Nitrite: NEGATIVE
PH: 7 (ref 5.0–8.0)
PROTEIN: 30 mg/dL — AB
RBC / HPF: >50 RBC/hpf — ABNORMAL HIGH (ref 0–5)
Specific Gravity, Urine: 1.013 (ref 1.005–1.030)

## 2018-02-17 MED ORDER — AZITHROMYCIN 250 MG PO TABS
1000.0000 mg | ORAL_TABLET | Freq: Once | ORAL | Status: AC
  Administered 2018-02-17: 1000 mg via ORAL
  Filled 2018-02-17: qty 4

## 2018-02-17 MED ORDER — CEFTRIAXONE SODIUM 250 MG IJ SOLR
250.0000 mg | Freq: Once | INTRAMUSCULAR | Status: AC
  Administered 2018-02-17: 250 mg via INTRAMUSCULAR
  Filled 2018-02-17: qty 250

## 2018-02-17 MED ORDER — STERILE WATER FOR INJECTION IJ SOLN
INTRAMUSCULAR | Status: AC
  Administered 2018-02-17: 1 mL
  Filled 2018-02-17: qty 10

## 2018-02-17 NOTE — Discharge Instructions (Addendum)
You were seen in the emergency department today for STD testing.  Your urine showed blood and white blood cells, this may be due to an STD, but this should be rechecked by primary care provider or by the health department.  We have sent off test for gonorrhea and chlamydia, we will call you within the next 3 to 4 days if these results are positive.  You were treated for gonorrhea and chlamydia while you are in the ER today.  If your results are positive you will need to inform all sexual partners.  Please use protection when sexually active.  Return to the ER for new or worsening symptoms or any other concerns.

## 2018-02-17 NOTE — ED Provider Notes (Signed)
[facility] EMERGENCY DEPARTMENT Provider Note   CSN: [id number] Arrival date & time: 02/17/18  1047     History   Chief Complaint Chief Complaint  Patient presents with  . Exposure to STD    HPI James Meadows is a 27 y.o. male with a history of tobacco abuse who presents to the emergency department requesting STD testing.  Patient states that he is currently asymptomatic therefore there are no specific alleviating or aggravating factors.  He states that he reportedly had unprotected intercourse with someone who tested positive for gonorrhea/chlamydia.  Denies fever, chills, dysuria, frequency, urgency, penile discharge, testicular pain/swelling, abdominal pain, or pain with bowel movements.   HPI  Past Medical History:  Diagnosis Date  . Depression   . Metacarpal bone fracture 07/2017   right small    Patient Active Problem List   Diagnosis Date Noted  . MDD (major depressive disorder), recurrent severe, without psychosis (HCC) 10/16/2017  . Suicidal ideation   . Overdose   . Major depressive disorder, recurrent severe without psychotic features (HCC) 04/29/2015  . PTSD (post-traumatic stress disorder) 04/29/2015  . Opioid dependence (HCC) 04/29/2015  . Marijuana abuse 08/22/2012  . ADHD (attention deficit hyperactivity disorder) 08/22/2012  . Episodic mood disorder (HCC) 08/22/2012    Past Surgical History:  Procedure Laterality Date  . NO PAST SURGERIES    . OPEN REDUCTION INTERNAL FIXATION (ORIF) METACARPAL Right 08/08/2017   Procedure: OPEN REDUCTION INTERNAL FIXATION (ORIF) RIGHT SMALL METACARPAL;  Surgeon: Betha Loa, MD;  Location: [facility];  Service: Orthopedics;  Laterality: Right;  . ORIF METACARPAL FRACTURE          Home Medications    Prior to Admission medications   Medication Sig Start Date End Date Taking? Authorizing Provider  diclofenac (VOLTAREN) 75 MG EC tablet Take 1 tablet (75 mg total) by mouth 2 (two) times daily. Take  with food 11/24/17   Triplett, Tammy, PA-C  hydrOXYzine (ATARAX/VISTARIL) 25 MG tablet Take 1 tablet (25 mg total) by mouth every 6 (six) hours as needed for anxiety. 10/19/17   Money, Gerlene Burdock, FNP  prazosin (MINIPRESS) 1 MG capsule Take 1 capsule (1 mg total) by mouth at bedtime. PTSD nightmares 10/19/17   Money, Gerlene Burdock, FNP  QUEtiapine (SEROQUEL) 50 MG tablet Take 1 tablet (50 mg total) by mouth at bedtime. For mood control 10/19/17   Money, Gerlene Burdock, FNP  sertraline (ZOLOFT) 50 MG tablet Take 1 tablet (50 mg total) by mouth daily. For mood control 10/19/17   Money, Gerlene Burdock, FNP    Family History No family history on file.  Social History Social History   Tobacco Use  . Smoking status: Current Some Day Smoker    Types: Cigarettes  . Smokeless tobacco: Never Used  . Tobacco comment: 2 cig./day  Substance Use Topics  . Alcohol use: Not Currently    Frequency: Never  . Drug use: Yes    Types: Marijuana    Comment: daily     Allergies   Latex and Oxycodone   Review of Systems Review of Systems  Constitutional: Negative for chills and fever.  Gastrointestinal: Negative for abdominal pain and rectal pain.  Genitourinary: Negative for dysuria, frequency, genital sores, hematuria, penile pain, penile swelling, scrotal swelling, testicular pain and urgency.     Physical Exam Updated Vital Signs BP 124/75 (BP Location: Right Arm)   Pulse 73   Temp 98.6 F (37 C) (Oral)   Resp 17  Ht 5\' 8"  (1.727 m)   Wt 63.5 kg   SpO2 99%   BMI 21.29 kg/m   Physical Exam  Constitutional: He appears well-developed and well-nourished. No distress.  HENT:  Head: Normocephalic and atraumatic.  Eyes: Conjunctivae are normal. Right eye exhibits no discharge. Left eye exhibits no discharge.  Abdominal: Soft. He exhibits no distension. There is no tenderness. There is no rebound and no guarding.  Genitourinary: Testes normal. Right testis shows no mass, no swelling and no tenderness. Right  testis is descended. Left testis shows no mass, no swelling and no tenderness. Left testis is descended. Circumcised. No penile erythema or penile tenderness. No discharge found.  Genitourinary Comments: RN Marcelino Duster present as chaperone.   Lymphadenopathy: No inguinal adenopathy noted on the right or left side.  Neurological: He is alert.  Clear speech.   Psychiatric: He has a normal mood and affect. His behavior is normal. Thought content normal.  Nursing note and vitals reviewed.    ED Treatments / Results  Labs (all labs ordered are listed, but only abnormal results are displayed) Labs Reviewed  URINALYSIS, ROUTINE W REFLEX MICROSCOPIC - Abnormal; Notable for the following components:      Result Value   APPearance HAZY (*)    Hgb urine dipstick MODERATE (*)    Protein, ur 30 (*)    Leukocytes, UA LARGE (*)    RBC / HPF >50 (*)    WBC, UA >50 (*)    All other components within normal limits  URINE CULTURE  GC/CHLAMYDIA PROBE AMP ([place]) NOT AT [facility]    EKG None  Radiology No results found.  Procedures Procedures (including critical care time)  Medications Ordered in ED Medications  cefTRIAXone (ROCEPHIN) injection 250 mg (250 mg Intramuscular Given 02/17/18 1209)  azithromycin (ZITHROMAX) tablet 1,000 mg (1,000 mg Oral Given 02/17/18 1209)  sterile water (preservative free) injection (1 mL  Given 02/17/18 1209)     Initial Impression / Assessment and Plan / ED Course  I have reviewed the triage vital signs and the nursing notes.  Pertinent labs & imaging results that were available during my care of the patient were reviewed by me and considered in my medical decision making (see chart for details).   Patient presents to the emergency department requesting STD testing after known exposure to gonorrhea/chlamydia. Patient is asymptomatic. No H&P components to raise concern for prostatitis, orchitis, or epididymitis. Benign physical exam. UA with hematuria and  WBCs- have been present previously, will culture. GC/chlamydia pending- treated prophylactically with Azithromycin and Rocephin in the ED. Recommended HIV/RPR testing, patient declined. Patient aware he has cultures pending and will need to informal all sexual partners if these return positive. Discussed importance of protection. I discussed results, treatment plan, need for PCP follow-up, and return precautions with the patient. Provided opportunity for questions, patient confirmed understanding and is in agreement with plan.    Final Clinical Impressions(s) / ED Diagnoses   Final diagnoses:  STD exposure    ED Discharge Orders    None       Cherly Anderson, PA-C 02/17/18 1231    Samuel Jester, DO 02/18/18 2032

## 2018-02-17 NOTE — ED Triage Notes (Signed)
Pt states needing to get check for STD.  Partner told him she was positive for gonorrhea and chlamydia.  Pt denies any symptoms

## 2018-02-19 LAB — URINE CULTURE

## 2018-02-20 ENCOUNTER — Telehealth: Payer: Self-pay | Admitting: *Deleted

## 2018-02-20 NOTE — Telephone Encounter (Signed)
Post ED Visit - Positive Culture Follow-up  Culture report reviewed by antimicrobial stewardship pharmacist:  []  Enzo Bi, Pharm.D. []  Celedonio Miyamoto, Pharm.D., BCPS AQ-ID []  Garvin Fila, Pharm.D., BCPS []  Georgina Pillion, [address].D., BCPS []  [place], [address].D., BCPS, AAHIVP []  Estella Husk, Pharm.D., BCPS, AAHIVP []  Lysle Pearl, PharmD, BCPS []  Phillips Climes, PharmD, BCPS []  Agapito Games, PharmD, BCPS []  Verlan Friends, PharmD  Positive urine culture, reviewed by Terance Hart, PA-C No further patient follow-up is required at this time.  Virl Axe [facility] 02/20/2018, 10:35 AM

## 2019-05-11 IMAGING — DX DG ABDOMEN ACUTE W/ 1V CHEST
3 series · 3 of 3 positions shown · non-contrast
Comparison: Abdominal CT 12/14/2016

CLINICAL DATA: Abdominal pain and vomiting blood. Symptoms since
drinking half a gallon of Bersaida 2 days ago. Initial encounter.

EXAM:
DG ABDOMEN ACUTE W/ 1V CHEST

[chest pa]
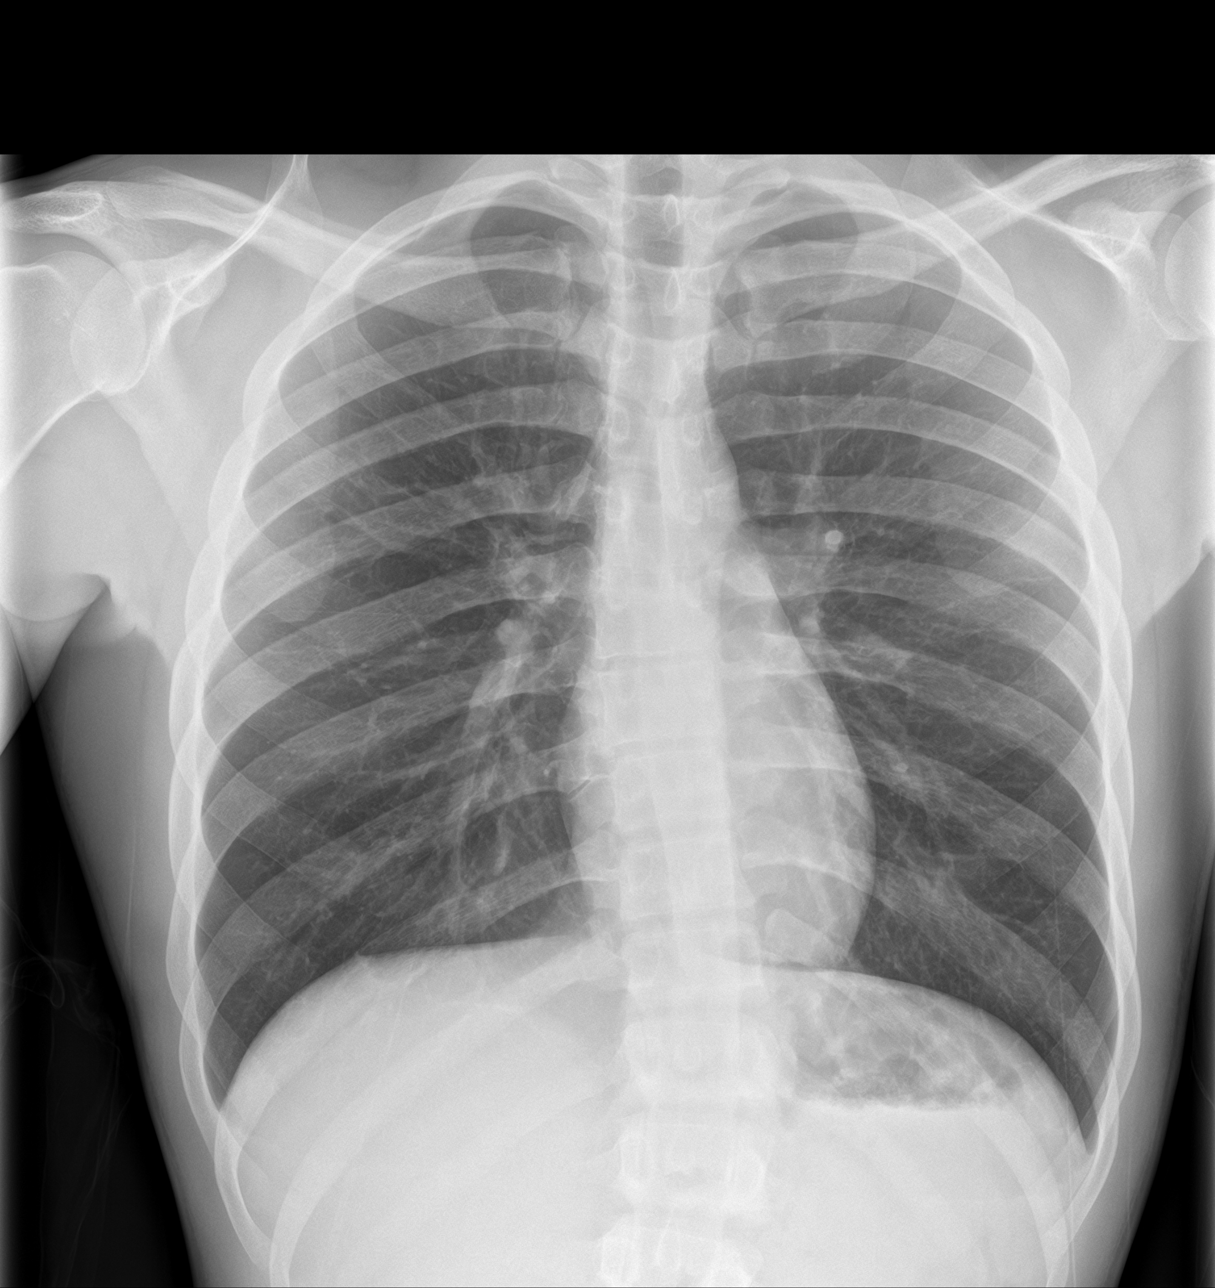

[abdomen erect]
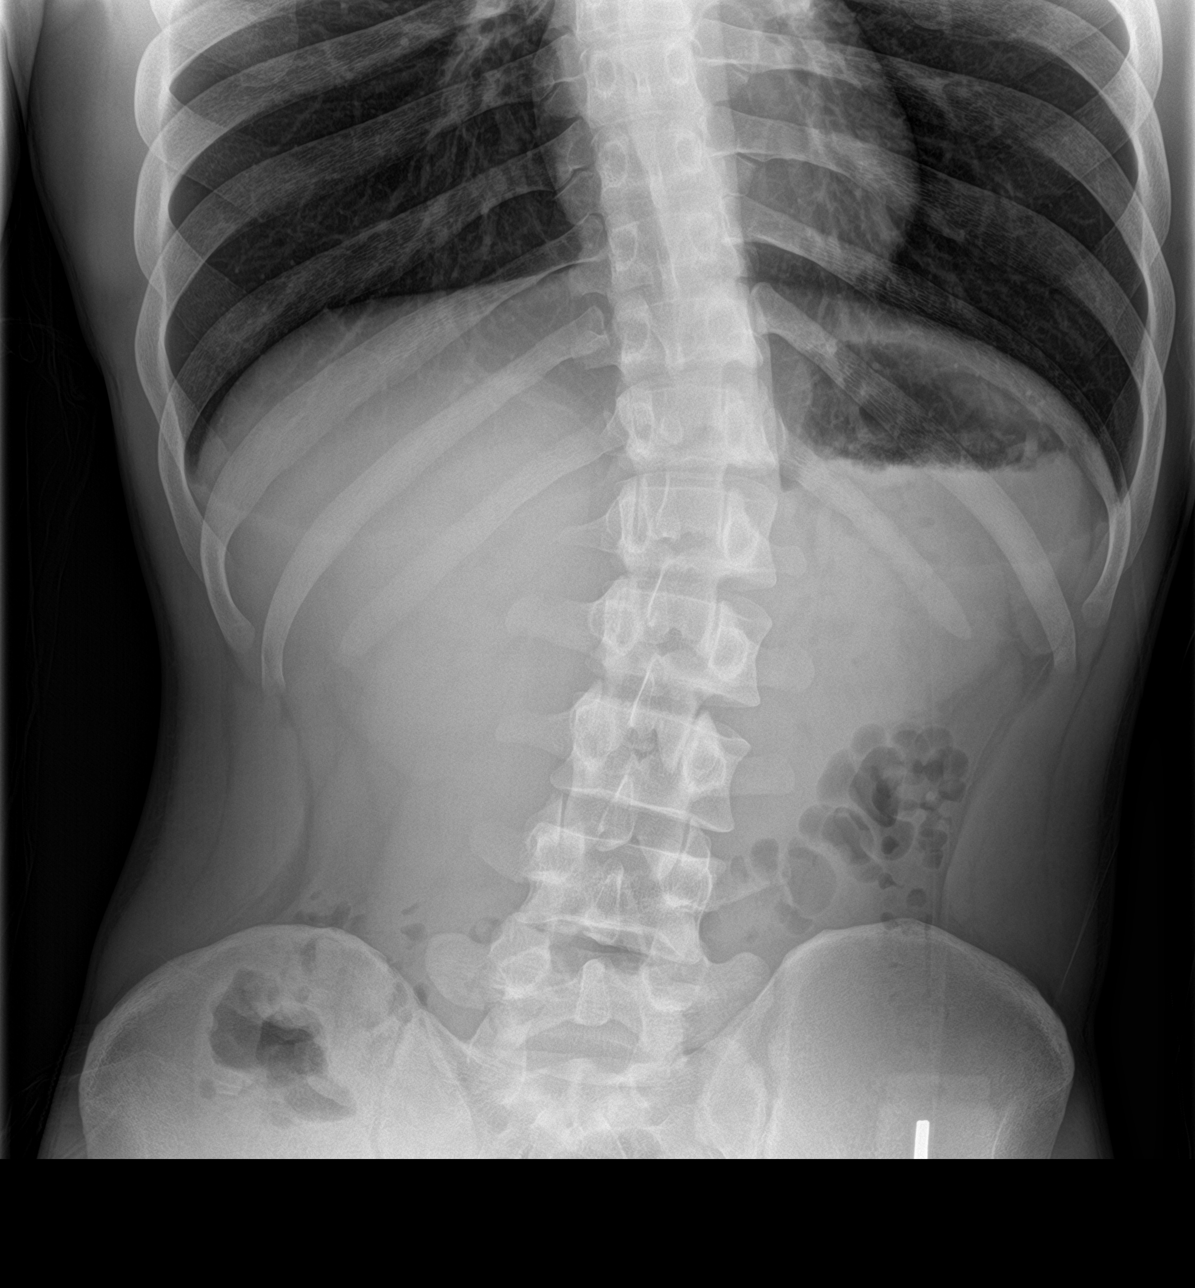

[abdomen supine]
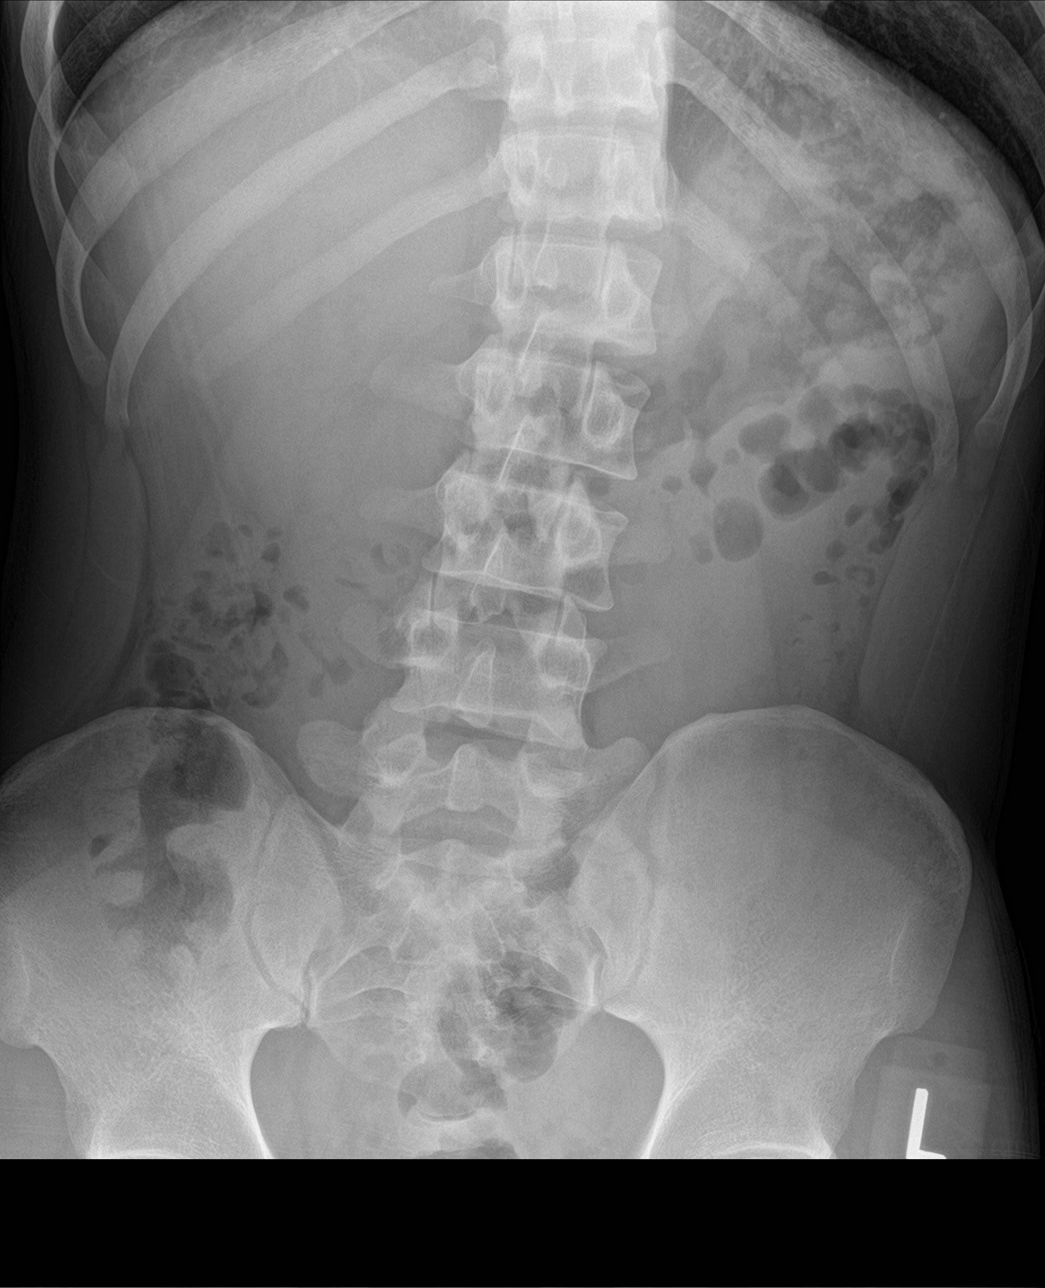

[3 of 3 positions shown; findings below may reference images not displayed]

FINDINGS: There is no edema, consolidation, effusion, or pneumothorax. Normal
heart size and mediastinal contours. Normal bowel gas pattern. No
evidence of pneumoperitoneum. No concerning mass effect or
calcification. Thoracolumbar scoliosis.
IMPRESSION: 1. Negative abdominal radiographs. No acute cardiopulmonary disease.
2. Thoracolumbar scoliosis.

## 2019-08-03 ENCOUNTER — Ambulatory Visit: Payer: Medicaid Other | Attending: Internal Medicine

## 2019-08-03 ENCOUNTER — Other Ambulatory Visit: Payer: Self-pay

## 2019-08-03 DIAGNOSIS — Z20822 Contact with and (suspected) exposure to covid-19: Secondary | ICD-10-CM

## 2019-08-04 LAB — SARS-COV-2, NAA 2 DAY TAT

## 2019-08-04 LAB — NOVEL CORONAVIRUS, NAA: SARS-CoV-2, NAA: NOT DETECTED

## 2020-01-22 DEATH — deceased
